# Patient Record
Sex: Female | Born: 1935 | Race: White | Hispanic: No | Smoking: Former smoker
Health system: Southern US, Community
[De-identification: ages and names within clinical notes are randomized; demographics above are authoritative.]

## PROBLEM LIST (undated history)

## (undated) DIAGNOSIS — R112 Nausea with vomiting, unspecified: Secondary | ICD-10-CM

## (undated) DIAGNOSIS — Z9889 Other specified postprocedural states: Secondary | ICD-10-CM

## (undated) DIAGNOSIS — F0390 Unspecified dementia without behavioral disturbance: Secondary | ICD-10-CM

## (undated) DIAGNOSIS — H8109 Meniere's disease, unspecified ear: Secondary | ICD-10-CM

## (undated) DIAGNOSIS — M810 Age-related osteoporosis without current pathological fracture: Secondary | ICD-10-CM

## (undated) DIAGNOSIS — D649 Anemia, unspecified: Secondary | ICD-10-CM

## (undated) DIAGNOSIS — J449 Chronic obstructive pulmonary disease, unspecified: Secondary | ICD-10-CM

## (undated) DIAGNOSIS — I2510 Atherosclerotic heart disease of native coronary artery without angina pectoris: Secondary | ICD-10-CM

## (undated) DIAGNOSIS — E039 Hypothyroidism, unspecified: Secondary | ICD-10-CM

## (undated) DIAGNOSIS — E785 Hyperlipidemia, unspecified: Secondary | ICD-10-CM

## (undated) DIAGNOSIS — R296 Repeated falls: Secondary | ICD-10-CM

## (undated) DIAGNOSIS — M549 Dorsalgia, unspecified: Secondary | ICD-10-CM

---

## 2001-03-31 ENCOUNTER — Ambulatory Visit (HOSPITAL_COMMUNITY): Admission: RE | Admit: 2001-03-31 | Discharge: 2001-03-31 | Payer: Self-pay | Admitting: Family Medicine

## 2001-03-31 ENCOUNTER — Encounter: Payer: Self-pay | Admitting: Family Medicine

## 2003-04-30 ENCOUNTER — Ambulatory Visit (HOSPITAL_COMMUNITY): Admission: RE | Admit: 2003-04-30 | Discharge: 2003-04-30 | Payer: Self-pay | Admitting: Family Medicine

## 2004-04-20 ENCOUNTER — Ambulatory Visit: Payer: Self-pay | Admitting: Orthopedic Surgery

## 2004-05-18 ENCOUNTER — Ambulatory Visit: Payer: Self-pay | Admitting: Orthopedic Surgery

## 2004-07-03 ENCOUNTER — Ambulatory Visit (HOSPITAL_COMMUNITY): Admission: RE | Admit: 2004-07-03 | Discharge: 2004-07-03 | Payer: Self-pay | Admitting: Family Medicine

## 2004-09-16 ENCOUNTER — Other Ambulatory Visit: Admission: RE | Admit: 2004-09-16 | Discharge: 2004-09-16 | Payer: Self-pay | Admitting: Obstetrics and Gynecology

## 2005-03-13 ENCOUNTER — Emergency Department (HOSPITAL_COMMUNITY): Admission: EM | Admit: 2005-03-13 | Discharge: 2005-03-13 | Payer: Self-pay | Admitting: Emergency Medicine

## 2006-05-17 ENCOUNTER — Ambulatory Visit (HOSPITAL_COMMUNITY): Admission: RE | Admit: 2006-05-17 | Discharge: 2006-05-17 | Payer: Self-pay | Admitting: Family Medicine

## 2006-11-03 ENCOUNTER — Ambulatory Visit: Payer: Self-pay | Admitting: Orthopedic Surgery

## 2007-03-01 HISTORY — PX: COLONOSCOPY: SHX174

## 2007-03-13 ENCOUNTER — Ambulatory Visit: Payer: Self-pay | Admitting: Gastroenterology

## 2007-03-13 ENCOUNTER — Encounter: Payer: Self-pay | Admitting: Gastroenterology

## 2007-03-13 ENCOUNTER — Ambulatory Visit (HOSPITAL_COMMUNITY): Admission: RE | Admit: 2007-03-13 | Discharge: 2007-03-13 | Payer: Self-pay | Admitting: Gastroenterology

## 2008-01-22 ENCOUNTER — Ambulatory Visit: Payer: Self-pay | Admitting: Orthopedic Surgery

## 2008-01-22 DIAGNOSIS — M5430 Sciatica, unspecified side: Secondary | ICD-10-CM

## 2008-02-07 ENCOUNTER — Ambulatory Visit: Payer: Self-pay | Admitting: Orthopedic Surgery

## 2008-02-12 ENCOUNTER — Ambulatory Visit (HOSPITAL_COMMUNITY): Admission: RE | Admit: 2008-02-12 | Discharge: 2008-02-12 | Payer: Self-pay | Admitting: Orthopedic Surgery

## 2008-02-19 ENCOUNTER — Ambulatory Visit: Payer: Self-pay | Admitting: Orthopedic Surgery

## 2008-02-19 DIAGNOSIS — M5126 Other intervertebral disc displacement, lumbar region: Secondary | ICD-10-CM

## 2008-02-19 DIAGNOSIS — M479 Spondylosis, unspecified: Secondary | ICD-10-CM | POA: Insufficient documentation

## 2008-02-29 ENCOUNTER — Telehealth: Payer: Self-pay | Admitting: Orthopedic Surgery

## 2008-03-07 ENCOUNTER — Encounter: Admission: RE | Admit: 2008-03-07 | Discharge: 2008-03-07 | Payer: Self-pay | Admitting: Orthopedic Surgery

## 2008-03-28 ENCOUNTER — Encounter: Admission: RE | Admit: 2008-03-28 | Discharge: 2008-03-28 | Payer: Self-pay | Admitting: Orthopedic Surgery

## 2008-08-16 ENCOUNTER — Encounter: Admission: RE | Admit: 2008-08-16 | Discharge: 2008-08-16 | Payer: Self-pay | Admitting: Orthopedic Surgery

## 2009-02-06 ENCOUNTER — Encounter (INDEPENDENT_AMBULATORY_CARE_PROVIDER_SITE_OTHER): Payer: Self-pay | Admitting: *Deleted

## 2009-02-06 ENCOUNTER — Ambulatory Visit: Payer: Self-pay | Admitting: Orthopedic Surgery

## 2009-02-12 ENCOUNTER — Encounter: Admission: RE | Admit: 2009-02-12 | Discharge: 2009-02-12 | Payer: Self-pay | Admitting: Orthopedic Surgery

## 2009-03-05 ENCOUNTER — Encounter: Payer: Self-pay | Admitting: Orthopedic Surgery

## 2010-02-01 ENCOUNTER — Observation Stay (HOSPITAL_COMMUNITY)
Admission: EM | Admit: 2010-02-01 | Discharge: 2010-02-04 | Payer: Self-pay | Source: Home / Self Care | Admitting: Emergency Medicine

## 2010-03-18 ENCOUNTER — Encounter (INDEPENDENT_AMBULATORY_CARE_PROVIDER_SITE_OTHER): Payer: Self-pay | Admitting: *Deleted

## 2010-04-06 ENCOUNTER — Emergency Department (HOSPITAL_COMMUNITY)
Admission: EM | Admit: 2010-04-06 | Discharge: 2010-04-06 | Payer: Self-pay | Source: Home / Self Care | Admitting: Emergency Medicine

## 2010-06-21 ENCOUNTER — Encounter: Payer: Self-pay | Admitting: Family Medicine

## 2010-06-30 NOTE — Letter (Signed)
Summary: Recall Colonoscopy/Endoscopy, Change to Office Visit  [facility]  [address]   [place], [place] [address]   Phone: [phone]  Fax: [phone]      March 18, 2010   [name] [name] [address] [place], [place]  [address] Oct 21, 1925   Dear Ms. MADISON-Lippmann,   According to our records, it is time for you to schedule a Colonoscopy/Endoscopy. However, after reviewing your medical record, we recommend an office visit in order to determine your need for a repeat procedure.  Please call [phone] at your convenience to schedule an office visit. If you have any questions or concerns, please feel free to contact our office.   Sincerely,   Ave Filter  [facility] Ph: [phone]   Fax: [phone]

## 2010-08-13 LAB — URINALYSIS, ROUTINE W REFLEX MICROSCOPIC
Bilirubin Urine: NEGATIVE
Glucose, UA: NEGATIVE mg/dL
Hgb urine dipstick: NEGATIVE
Ketones, ur: NEGATIVE mg/dL
Nitrite: NEGATIVE
Protein, ur: NEGATIVE mg/dL
Specific Gravity, Urine: >1.03 — ABNORMAL HIGH (ref 1.005–1.030)
Urobilinogen, UA: 0.2 mg/dL (ref 0.0–1.0)
pH: 6 (ref 5.0–8.0)

## 2010-08-13 LAB — DIFFERENTIAL
Basophils Absolute: 0 10*3/uL (ref 0.0–0.1)
Basophils Relative: 0 % (ref 0–1)
Eosinophils Absolute: 0.2 K/uL (ref 0.0–0.7)
Eosinophils Absolute: 0.3 10*3/uL (ref 0.0–0.7)
Eosinophils Relative: 3 % (ref 0–5)
Lymphocytes Relative: 26 % (ref 12–46)
Lymphs Abs: 2.3 10*3/uL (ref 0.7–4.0)
Lymphs Abs: 3.2 10*3/uL (ref 0.7–4.0)
Monocytes Absolute: 0.5 K/uL (ref 0.1–1.0)
Monocytes Relative: 5 % (ref 3–12)
Neutro Abs: 5.2 10*3/uL (ref 1.7–7.7)
Neutro Abs: 5.6 10*3/uL (ref 1.7–7.7)
Neutrophils Relative %: 55 % (ref 43–77)
Neutrophils Relative %: 65 % (ref 43–77)

## 2010-08-13 LAB — CBC
HCT: 36.7 % (ref 36.0–46.0)
Hemoglobin: 12.4 g/dL (ref 12.0–15.0)
MCH: 30.6 pg (ref 26.0–34.0)
MCH: 30.7 pg (ref 26.0–34.0)
MCHC: 33.8 g/dL (ref 30.0–36.0)
MCV: 90.8 fL (ref 78.0–100.0)
Platelets: 218 10*3/uL (ref 150–400)
Platelets: 227 10*3/uL (ref 150–400)
RBC: 3.98 MIL/uL (ref 3.87–5.11)
RBC: 4.04 MIL/uL (ref 3.87–5.11)
RDW: 14.6 % (ref 11.5–15.5)
WBC: 8.6 K/uL (ref 4.0–10.5)
WBC: 9.4 10*3/uL (ref 4.0–10.5)

## 2010-08-13 LAB — COMPREHENSIVE METABOLIC PANEL WITH GFR
ALT: 13 U/L (ref 0–35)
AST: 21 U/L (ref 0–37)
Albumin: 3.7 g/dL (ref 3.5–5.2)
Alkaline Phosphatase: 51 U/L (ref 39–117)
Glucose, Bld: 126 mg/dL — ABNORMAL HIGH (ref 70–99)
Potassium: 3.4 meq/L — ABNORMAL LOW (ref 3.5–5.1)
Sodium: 140 meq/L (ref 135–145)
Total Protein: 6.8 g/dL (ref 6.0–8.3)

## 2010-08-13 LAB — COMPREHENSIVE METABOLIC PANEL
BUN: 21 mg/dL (ref 6–23)
CO2: 28 mEq/L (ref 19–32)
Calcium: 9.2 mg/dL (ref 8.4–10.5)
Chloride: 106 mEq/L (ref 96–112)
Creatinine, Ser: 1.14 mg/dL (ref 0.4–1.2)
GFR calc Af Amer: 55 mL/min — ABNORMAL LOW (ref >60)
GFR calc non Af Amer: 46 mL/min — ABNORMAL LOW (ref >60)
Total Bilirubin: 0.3 mg/dL (ref 0.3–1.2)

## 2010-08-13 LAB — BASIC METABOLIC PANEL
CO2: 29 mEq/L (ref 19–32)
Calcium: 9.1 mg/dL (ref 8.4–10.5)
Creatinine, Ser: 1.03 mg/dL (ref 0.4–1.2)
GFR calc Af Amer: >60 mL/min (ref >60)
GFR calc non Af Amer: 51 mL/min — ABNORMAL LOW (ref >60)

## 2010-08-13 LAB — GLUCOSE, CAPILLARY: Glucose-Capillary: 114 mg/dL — ABNORMAL HIGH (ref 70–99)

## 2010-08-13 LAB — T4, FREE: Free T4: 1.5 ng/dL (ref 0.80–1.80)

## 2010-08-13 LAB — SALICYLATE LEVEL: Salicylate Lvl: <4 mg/dL (ref 2.8–20.0)

## 2010-08-13 LAB — MRSA PCR SCREENING: MRSA by PCR: NEGATIVE

## 2010-08-13 LAB — ACETAMINOPHEN LEVEL: Acetaminophen (Tylenol), Serum: <10 ug/mL — ABNORMAL LOW (ref 10–30)

## 2010-08-13 LAB — TSH: TSH: 3.386 u[IU]/mL (ref 0.350–4.500)

## 2010-08-19 ENCOUNTER — Other Ambulatory Visit (HOSPITAL_COMMUNITY): Payer: Self-pay | Admitting: Family Medicine

## 2010-08-19 DIAGNOSIS — Z139 Encounter for screening, unspecified: Secondary | ICD-10-CM

## 2010-08-25 ENCOUNTER — Ambulatory Visit (HOSPITAL_COMMUNITY)
Admission: RE | Admit: 2010-08-25 | Discharge: 2010-08-25 | Disposition: A | Discharge status: Home / Self Care | Payer: Medicare Other | Source: Ambulatory Visit | Attending: Family Medicine | Admitting: Family Medicine

## 2010-08-25 DIAGNOSIS — Z139 Encounter for screening, unspecified: Secondary | ICD-10-CM

## 2010-09-15 ENCOUNTER — Emergency Department (HOSPITAL_COMMUNITY): Payer: No Typology Code available for payment source

## 2010-09-15 ENCOUNTER — Observation Stay (HOSPITAL_COMMUNITY)
Admission: EM | Admit: 2010-09-15 | Discharge: 2010-09-18 | Disposition: A | Discharge status: Nursing Home (Includes ICF) | Payer: No Typology Code available for payment source | Attending: Family Medicine | Admitting: Family Medicine

## 2010-09-15 DIAGNOSIS — M8448XA Pathological fracture, other site, initial encounter for fracture: Principal | ICD-10-CM | POA: Insufficient documentation

## 2010-09-15 DIAGNOSIS — Z79899 Other long term (current) drug therapy: Secondary | ICD-10-CM | POA: Insufficient documentation

## 2010-09-15 DIAGNOSIS — M545 Low back pain, unspecified: Secondary | ICD-10-CM | POA: Insufficient documentation

## 2010-09-15 DIAGNOSIS — Z20828 Contact with and (suspected) exposure to other viral communicable diseases: Secondary | ICD-10-CM | POA: Insufficient documentation

## 2010-09-15 DIAGNOSIS — F0390 Unspecified dementia without behavioral disturbance: Secondary | ICD-10-CM | POA: Insufficient documentation

## 2010-09-15 DIAGNOSIS — R209 Unspecified disturbances of skin sensation: Secondary | ICD-10-CM | POA: Insufficient documentation

## 2010-09-15 DIAGNOSIS — M810 Age-related osteoporosis without current pathological fracture: Secondary | ICD-10-CM | POA: Insufficient documentation

## 2010-09-15 DIAGNOSIS — F319 Bipolar disorder, unspecified: Secondary | ICD-10-CM | POA: Insufficient documentation

## 2010-09-15 DIAGNOSIS — E785 Hyperlipidemia, unspecified: Secondary | ICD-10-CM | POA: Insufficient documentation

## 2010-09-15 DIAGNOSIS — E039 Hypothyroidism, unspecified: Secondary | ICD-10-CM | POA: Insufficient documentation

## 2010-09-15 DIAGNOSIS — R4182 Altered mental status, unspecified: Secondary | ICD-10-CM | POA: Insufficient documentation

## 2010-09-15 DIAGNOSIS — F068 Other specified mental disorders due to known physiological condition: Secondary | ICD-10-CM | POA: Insufficient documentation

## 2010-09-15 LAB — URINALYSIS, ROUTINE W REFLEX MICROSCOPIC
Bilirubin Urine: NEGATIVE
Ketones, ur: NEGATIVE mg/dL
Nitrite: NEGATIVE
Protein, ur: NEGATIVE mg/dL
Urobilinogen, UA: 0.2 mg/dL (ref 0.0–1.0)

## 2010-09-15 LAB — COMPREHENSIVE METABOLIC PANEL
AST: 20 U/L (ref 0–37)
CO2: 27 mEq/L (ref 19–32)
Calcium: 9.4 mg/dL (ref 8.4–10.5)
Creatinine, Ser: 0.95 mg/dL (ref 0.4–1.2)
GFR calc Af Amer: >60 mL/min (ref >60)
GFR calc non Af Amer: 56 mL/min — ABNORMAL LOW (ref >60)

## 2010-09-15 LAB — DIFFERENTIAL
Basophils Relative: 0 % (ref 0–1)
Eosinophils Absolute: 0.2 10*3/uL (ref 0.0–0.7)
Eosinophils Relative: 2 % (ref 0–5)
Monocytes Absolute: 1.1 10*3/uL — ABNORMAL HIGH (ref 0.1–1.0)
Monocytes Relative: 10 % (ref 3–12)
Neutro Abs: 7.3 10*3/uL (ref 1.7–7.7)

## 2010-09-15 LAB — CBC
Hemoglobin: 12.4 g/dL (ref 12.0–15.0)
MCH: 29.5 pg (ref 26.0–34.0)
MCHC: 32.9 g/dL (ref 30.0–36.0)

## 2010-09-15 LAB — INFLUENZA PANEL BY PCR (TYPE A & B)
H1N1 flu by pcr: NOT DETECTED
Influenza B By PCR: NEGATIVE

## 2010-09-16 LAB — MRSA PCR SCREENING: MRSA by PCR: NEGATIVE

## 2010-09-16 LAB — VITAMIN B12: Vitamin B-12: 257 pg/mL (ref 211–911)

## 2010-09-17 LAB — BASIC METABOLIC PANEL
BUN: 15 mg/dL (ref 6–23)
CO2: 27 mEq/L (ref 19–32)
Chloride: 109 mEq/L (ref 96–112)
Glucose, Bld: 98 mg/dL (ref 70–99)
Potassium: 4.3 mEq/L (ref 3.5–5.1)

## 2010-09-22 NOTE — Consult Note (Signed)
  [name] [name], [name] [name]       ACCOUNT NO.:  [id number]  MEDICAL RECORD NO.:  [id number]          PATIENT TYPE:  INP  LOCATION:                                FACILITY:  [facility]  PHYSICIAN:  Vickki Hearing, M.D.DATE OF BIRTH:  21-Sep-1925  DATE OF CONSULTATION:  09/16/2010 DATE OF DISCHARGE:                                CONSULTATION   REQUESTING PHYSICIAN:  Melvyn Novas, MD  REASON FOR CONSULTATION:  Back pain.  HISTORY:  Recorded in the medical record.  This is an 76 year old female, who resides at [facility], who has moderate degree of dementia, history of bipolar disease, has a history of lumbar disk disease, had a compression fracture treated by kyphoplasty presents with increasing back pain over  48 hours.  Radiographs, plain films were negative.  History of breast lumpectomy on the left, right femur fracture, internal fixation and kyphoplasty of L4.  On Pravachol, Namenda, Synthroid, and aspirin, as well as Seroquel.  She could not give a good history, she was confused.  Her vital signs were stable.  Her overall appearance was normal.  Her frame was small.  Her pulses were excellent.  Perfusion was normal.  No obvious lymphadenopathy palpable in cervical area or groin area.  Skin was intact x4 extremities.  Strength was intact x4 extremities.  She was tender at or about L2 in the lumbar spine.  She had mild kyphosis.  She had no focal neurologic deficits.  Muscle tone was normal.  Straight-leg raise was negative.  X-rays initially were negative.  MRI shows a subacute fracture of L2.  I discussed with the patient brace versus kyphoplasty, and she could not really understand me, hopefully a family member is around who can better delineate treatment and plan for her.     Vickki Hearing, M.D.     SEH/MEDQ  D:  09/17/2010  T:  09/17/2010  Job:  [id number]  Electronically Signed by Fuller Canada M.D. on 09/22/2010 08:36:01 AM

## 2010-09-25 ENCOUNTER — Emergency Department (HOSPITAL_COMMUNITY): Payer: No Typology Code available for payment source

## 2010-09-25 ENCOUNTER — Emergency Department (HOSPITAL_COMMUNITY)
Admission: EM | Admit: 2010-09-25 | Discharge: 2010-09-25 | Disposition: A | Discharge status: Home / Self Care | Payer: No Typology Code available for payment source | Attending: Emergency Medicine | Admitting: Emergency Medicine

## 2010-09-25 DIAGNOSIS — M549 Dorsalgia, unspecified: Secondary | ICD-10-CM | POA: Insufficient documentation

## 2010-09-25 DIAGNOSIS — W19XXXA Unspecified fall, initial encounter: Secondary | ICD-10-CM | POA: Insufficient documentation

## 2010-09-25 DIAGNOSIS — Y9210 Unspecified residential institution as the place of occurrence of the external cause: Secondary | ICD-10-CM | POA: Insufficient documentation

## 2010-09-25 DIAGNOSIS — Z79899 Other long term (current) drug therapy: Secondary | ICD-10-CM | POA: Insufficient documentation

## 2010-09-25 DIAGNOSIS — R55 Syncope and collapse: Secondary | ICD-10-CM | POA: Insufficient documentation

## 2010-09-25 DIAGNOSIS — E039 Hypothyroidism, unspecified: Secondary | ICD-10-CM | POA: Insufficient documentation

## 2010-09-25 DIAGNOSIS — T148XXA Other injury of unspecified body region, initial encounter: Secondary | ICD-10-CM | POA: Insufficient documentation

## 2010-09-25 DIAGNOSIS — I6529 Occlusion and stenosis of unspecified carotid artery: Secondary | ICD-10-CM | POA: Insufficient documentation

## 2010-09-25 DIAGNOSIS — F0390 Unspecified dementia without behavioral disturbance: Secondary | ICD-10-CM | POA: Insufficient documentation

## 2010-09-25 DIAGNOSIS — Y998 Other external cause status: Secondary | ICD-10-CM | POA: Insufficient documentation

## 2010-09-25 DIAGNOSIS — M810 Age-related osteoporosis without current pathological fracture: Secondary | ICD-10-CM | POA: Insufficient documentation

## 2010-09-25 DIAGNOSIS — E785 Hyperlipidemia, unspecified: Secondary | ICD-10-CM | POA: Insufficient documentation

## 2010-09-25 DIAGNOSIS — Z7982 Long term (current) use of aspirin: Secondary | ICD-10-CM | POA: Insufficient documentation

## 2010-09-25 DIAGNOSIS — N390 Urinary tract infection, site not specified: Secondary | ICD-10-CM | POA: Insufficient documentation

## 2010-09-25 LAB — URINALYSIS, ROUTINE W REFLEX MICROSCOPIC
Glucose, UA: NEGATIVE mg/dL
Ketones, ur: NEGATIVE mg/dL
Specific Gravity, Urine: 1.02 (ref 1.005–1.030)
pH: 7 (ref 5.0–8.0)

## 2010-09-25 LAB — DIFFERENTIAL
Basophils Relative: 0 % (ref 0–1)
Lymphs Abs: 1.4 10*3/uL (ref 0.7–4.0)
Monocytes Relative: 7 % (ref 3–12)
Neutro Abs: 7.6 10*3/uL (ref 1.7–7.7)
Neutrophils Relative %: 77 % (ref 43–77)

## 2010-09-25 LAB — URINE MICROSCOPIC-ADD ON

## 2010-09-25 LAB — CBC
Hemoglobin: 12.6 g/dL (ref 12.0–15.0)
MCH: 29.2 pg (ref 26.0–34.0)
MCV: 89.6 fL (ref 78.0–100.0)
RBC: 4.31 MIL/uL (ref 3.87–5.11)
WBC: 9.8 10*3/uL (ref 4.0–10.5)

## 2010-09-25 LAB — BASIC METABOLIC PANEL
Chloride: 104 mEq/L (ref 96–112)
GFR calc Af Amer: >60 mL/min (ref >60)
Potassium: 3.4 mEq/L — ABNORMAL LOW (ref 3.5–5.1)

## 2010-09-25 LAB — POCT CARDIAC MARKERS: Troponin i, poc: <0.05 ng/mL (ref 0.00–0.09)

## 2010-09-27 LAB — URINE CULTURE

## 2010-09-29 NOTE — H&P (Signed)
NAMESHANNEN, FLANSBURG       ACCOUNT NO.:  [id number]  MEDICAL RECORD NO.:  [id number]           PATIENT TYPE:  I  LOCATION:  A210                          FACILITY:  [facility]  PHYSICIAN:  Melvyn Novas, MDDATE OF BIRTH:  [birth year]  DATE OF ADMISSION:  09/15/2010 DATE OF DISCHARGE:  LH                             HISTORY & PHYSICAL   The patient is an 77 year old white female resident of [place] who has some mild-to-moderate degree of dementia as well as bipolarity who apparently has been complaining of increasing severe back pain for a 48-hour period.  This is unremitting and has some mild radiation to her left buttock.  She was seen in the ER and found to have no evidence of UTI.  Lumbosacral x-rays reveal a 50% anterior compression fracture which is unchanged from previous compression fracture documented by MRI in 2009, for which she has had kyphoplasty, concern is that she has some foraminal stenosis and possible left lumbar radiculopathy on the basis of this. We will perform MRI to see if there is any interval change. She denies angina, orthopnea, PND, dyspnea, no dysuria or frequency.  No rigors or chills.  PAST MEDICAL HISTORY:  Significant aforementioned moderate dementia, hypothyroidism, hyperlipidemia, osteoporosis, L4 compression fracture, peripheral arterial disease, bipolar disorder and history of UTI.  PAST SURGICAL HISTORY:  Remarkable for left breast lumpectomy, right femur fracture pending and kyphoplasty of L4 done within the last 2 years.  CURRENT MEDICATIONS: 1. Pravachol 40 mg p.o. daily. 2. Namenda 10 mg p.o. b.i.d. 3. Synthroid 75 mcg p.o. daily. 4. Aspirin 81 mg per day. 5. Seroquel 100 mg p.o. at bedtime.  PHYSICAL EXAMINATION:  VITAL SIGNS:  Blood pressure 154/55, temperature 98.6, pulse 69 and regular and respiratory rate is 20. EYES:  Extraocular movements intact.  Sclerae clear.  Conjunctivae pink. NECK:  No JVD, no carotid  bruits, no thyromegaly or bruits. LUNGS:  Clear to A and P.  No rales, wheeze or rhonchi appreciable. HEART:  Regular rhythm.  A 1/6 aortic outflow murmur.  No S3, S4, or gallops.  No heaves, thrills, or rubs. ABDOMEN:  Soft and nontender.  Bowel sounds are normoactive.  No guarding, rebound, or hepatosplenomegaly.  There is no CVA tenderness bilaterally.  No guarding, no rebound, or masses. ABDOMEN:  Left-to-right straight leg raise at 30 degrees produce pain. Plantars are downgoing bilaterally.  Sensory motor is 5/5 bilaterally. NEUROLOGIC:  Cranial nerves II through XII are grossly intact.  The patient is mildly disoriented to place, but not person and mildly disoriented to time.  IMPRESSION: 1. Severe low back pain unremitting in the phase of anterior L4     compression fracture, status post kyphoplasty. 2. Dementia. 3. Hyperlipidemia. 4. Osteoporosis. 5. Hypothyroidism. 6. Dementia and bipolarity.  PLAN:  At present is to perform RPR, B12 and TSH levels.  Get MRI of LS spine to see if there is any nerve compression or foraminal stenosis, continue to add Vicodin 5/500 p.o. q.i.d. p.r.n. and we will perform serial neurologic exams and decide if any intervention is warranted on the basis of this.  I hope CT scan of her head reveals no interval change,  small vessel disease and cerebral atrophy.     Melvyn Novas, MD     RMD/MEDQ  D:  09/15/2010  T:  09/16/2010  Job:  [id number]  Electronically Signed by Oval Linsey MD on 09/29/2010 01:43:06 PM

## 2010-09-29 NOTE — H&P (Signed)
  NAMEJONDA, [name] [name] NO.:  [id number]  MEDICAL RECORD NO.:  [id number]           PATIENT TYPE:  LOCATION:                                 FACILITY:  PHYSICIAN:  Melvyn Novas, MDDATE OF BIRTH:  06-Feb-1926  DATE OF ADMISSION: DATE OF DISCHARGE:  LH                             HISTORY & PHYSICAL   The patient is an 77 year old white female, resident of [place], with a mild-to-moderate degree of dementia, some manic behavior, some agitation history with hyperlipidemia who was well controlled. Apparently, she complained of sudden onset of 48-hour history of low back pain which did not sort of radiate.  She had a previous anterior compression fracture status post kyphoplasty approximately 2 years ago of L4 and had been doing well from that.  X-rays revealed no further significant interval change of her lumbosacral spine.  Due to possible thought of a radicular pain and herniated nucleus pulposus, an MRI was performed while in the hospital.  The MRI revealed loss of height at approximately 20% and acute or subacute endplate compression of L2 which was new and an interval change from previous MRIs.  This was followed with an old healed compression deformity at L4 which was previously mentioned.  The patient was seen in consultation by Dr. Romeo Apple, Orthopedics, and consideration was given to kyphoplasty.  It was elected to give her conservative therapy in the form of a CASH brace which she is to wear and analgesia/opioid in the form of Percocet 5/325 p.o. q.i.d. p.r.n. as well as physical therapy and a four-point walker.  The patient has minimal understanding of this.  She does not seem to comprehend medical issues; however, while in hospital, she turned out positive PCR for influenza A and there was no clinical evidence of influenza infection.  Due to fact that this is not reliable as for diagnosis of influenza A but rather exposure, she was  empirically given Tamiflu 75 p.o. b.i.d. for the 3 days while in hospital, she was continued on this for another additional 5 days while returning to [facility] with body fluid precautions.  DISCHARGE MEDICINES: 1. Hydrocodone 5/325 one p.o. q.i.d. p.r.n. for pain. 2. Tamiflu 75 p.o. b.i.d. for an additional 5 days. 3. Aspirin 81 mg p.o. daily. 4. Synthroid 75 mcg p.o. daily. 5. Namenda 10 mg p.o. b.i.d. 6. Pravastatin 40 mg p.o. daily. 7. Seroquel 100 mg p.o. nightly. 8. Vitamin B1/thiamine 100 mg p.o. daily.  She will follow up in the office in two week's time and also with Dr. Romeo Apple, Orthopedics.     Melvyn Novas, MD     RMD/MEDQ  D:  09/17/2010  T:  09/18/2010  Job:  [id number]  Electronically Signed by Oval Linsey MD on 09/29/2010 01:43:11 PM

## 2010-10-13 NOTE — Op Note (Signed)
[name] [name], [name] [name]              ACCOUNT NO.:  [id number]   MEDICAL RECORD NO.:  [id number]          PATIENT TYPE:  AMB   LOCATION:  DAY                           FACILITY:  [facility]   PHYSICIAN:  Kassie Mends, M.D.      DATE OF BIRTH:  24-Feb-1926   DATE OF PROCEDURE:  03/13/2007  DATE OF DISCHARGE:                               OPERATIVE REPORT   REFERRING PHYSICIAN:  Oval Linsey, MD   PROCEDURE:  Colonoscopy with cold forceps and snare cautery polypectomy  with Spot tattoo.   INDICATION FOR EXAM:  Ms. Wyn Forster is a 77 year old female who presents  for average risk colon cancer screening.  She has never had a  colonoscopy.   FINDINGS:  1. Pancolonic diverticulosis.  2. One 4 mm sessile ascending colon polyp removed via cold forceps.  3. Two 4-6 mm rectal polyps which were sessile.  One was removed via      cold forceps and the other via snare cautery.  4. A 1.5 cm pedunculated rectal polyp removed via snare cautery. The      site of the 1.5 cm polyp was tattooed with Spot.  5. Otherwise, no masses inflammatory changes, AVMs seen.  6. Normal retroflexed view of the rectum.   RECOMMENDATIONS:  1. We will call Ms. Madison with the results of her biopsies.  2. No aspirin or nonsteroidal anti-inflammatory drugs or      anticoagulation x7 days.  3. She should follow a high fiber diet.  She is given a handout on      high fiber diet, polyps, and diverticulosis.  4. Screening colonoscopy in 5 years if she remains healthy.   MEDICATIONS:  1. Demerol 25 mg IV.  2. Versed 3 mg IV.   PROCEDURE TECHNIQUE:  Physical exam was performed, and informed consent  was obtained from the patient after explaining the benefits, risks, and  alternatives to the procedure.  The patient was connected to the monitor  and placed in the left lateral position.  Continuous oxygen was provided  by nasal cannula and IV medicine mentioned through an indwelling  cannula.  After administration of sedation  and rectal exam, the  patient's rectum was intubated,  and the scope was advanced under direct visualization to the cecum.  The  scope was removed slowly by carefully examining the colon texture,  anatomy, and integrity of the mucosa on the way out.  The patient was  recovered in endoscopy and discharged to home in satisfactory condition.      Kassie Mends, M.D.  Electronically Signed     SM/MEDQ  D:  03/13/2007  T:  03/13/2007  Job:  [id number]   cc:   Melvyn Novas, MD  Fax: [phone]

## 2010-10-29 ENCOUNTER — Ambulatory Visit (INDEPENDENT_AMBULATORY_CARE_PROVIDER_SITE_OTHER): Payer: Medicare Other | Admitting: Orthopedic Surgery

## 2010-10-29 ENCOUNTER — Ambulatory Visit: Payer: Medicare Other | Admitting: Orthopedic Surgery

## 2010-10-29 DIAGNOSIS — S32009A Unspecified fracture of unspecified lumbar vertebra, initial encounter for closed fracture: Secondary | ICD-10-CM

## 2010-10-29 DIAGNOSIS — S32000A Wedge compression fracture of unspecified lumbar vertebra, initial encounter for closed fracture: Secondary | ICD-10-CM | POA: Insufficient documentation

## 2010-10-29 NOTE — Progress Notes (Signed)
Fracture followup.  Date of injury April 17  L2 acute/subacute vertebral body fracture. History of old L4 fracture.  Fracture diagnosed by MRI.  Patient has made significant improvements. Despite not wearing the brace was ordered.  Using bleeding without assistive device and has minimal symptoms not requiring any pain medication. Her neurologic symptoms are noted.  Radiograph was taken today.  Radiograph shows old L4 fracture recent L2 fracture with compression of the vertebrae. No kyphosis.  Patient is released

## 2011-01-08 NOTE — Discharge Summary (Signed)
NAME:  MATTHEW, PAIS            ACCOUNT NO.:  MEDICAL RECORD NO.:  [id number]  LOCATION:                                 FACILITY:  PHYSICIAN:  Melvyn Novas, MDDATE OF BIRTH:  Feb 26, 1926  DATE OF ADMISSION: DATE OF DISCHARGE:  LH                              DISCHARGE SUMMARY   HISTORY OF PRESENT ILLNESS:  The patient is an 77 year old white female resident of [place] who complained of increasing severity of back pain for 48-hour period subsequently admitted.  She had a history of old L4 compression fracture.  She was admitted due to significant nature of her chest pain and was felt she may have herniated nucleus pulposus with foraminal stenosis.  She was hemodynamically stable on admission.  She also has a history of dementia, hyperlipidemia, hypothyroidism, and agitation.  She was controlled with opioid analgesia, seen in consultation by Orthopedics and subsequent MRI was done to her lumbosacral spine which revealed old healed L4 compression fracture which was not new, however, there was a new acute or subacute compression fracture loss of 5-20% of L2.  This looked to be benign fracture, pathologic and due to osteoporosis.  The patient was recommended to have DEXA scan and assess her osteoporotic degree and was felt at this time the patient was stable.  Due to her advanced age, surgery which is kyphoplasty which was done previously is not indicated at present.  Conservative therapy was recommended.  MEDICATIONS:  She was subsequently discharged on the following medicines: 1. Pravachol 40 mg p.o. daily. 2. Namenda 10 mg p.o. b.i.d. 3. Synthroid 75 mcg p.o. daily. 4. Aspirin 81 mg p.o. daily. 5. Seroquel 100 mg p.o. at bedtime. 6. Vicodin 5/500 p.o. t.i.d. p.r.n. 7. Calcium plus vitamin D 600/400 p.o. daily. 8. Fosamax 70 mg p.o. q.week.  The patient will follow up in the office in one week's time to assess analgesia and any neurologic deficits  that may or may not ensue.     Melvyn Novas, MD     RMD/MEDQ  D:  01/07/2011  T:  01/07/2011  Job:  [id number]

## 2011-03-18 ENCOUNTER — Emergency Department (HOSPITAL_COMMUNITY)
Admission: EM | Admit: 2011-03-18 | Discharge: 2011-03-18 | Disposition: A | Discharge status: Nursing Home (Includes ICF) | Payer: Medicare Other | Attending: Emergency Medicine | Admitting: Emergency Medicine

## 2011-03-18 ENCOUNTER — Encounter: Payer: Self-pay | Admitting: *Deleted

## 2011-03-18 ENCOUNTER — Emergency Department (HOSPITAL_COMMUNITY): Payer: Medicare Other

## 2011-03-18 DIAGNOSIS — S0093XA Contusion of unspecified part of head, initial encounter: Secondary | ICD-10-CM

## 2011-03-18 DIAGNOSIS — M949 Disorder of cartilage, unspecified: Secondary | ICD-10-CM | POA: Insufficient documentation

## 2011-03-18 DIAGNOSIS — S1093XA Contusion of unspecified part of neck, initial encounter: Secondary | ICD-10-CM | POA: Insufficient documentation

## 2011-03-18 DIAGNOSIS — S0003XA Contusion of scalp, initial encounter: Secondary | ICD-10-CM | POA: Insufficient documentation

## 2011-03-18 DIAGNOSIS — Y9210 Unspecified residential institution as the place of occurrence of the external cause: Secondary | ICD-10-CM | POA: Insufficient documentation

## 2011-03-18 DIAGNOSIS — W19XXXA Unspecified fall, initial encounter: Secondary | ICD-10-CM | POA: Insufficient documentation

## 2011-03-18 DIAGNOSIS — F0390 Unspecified dementia without behavioral disturbance: Secondary | ICD-10-CM | POA: Insufficient documentation

## 2011-03-18 DIAGNOSIS — M899 Disorder of bone, unspecified: Secondary | ICD-10-CM | POA: Insufficient documentation

## 2011-03-18 DIAGNOSIS — Z9181 History of falling: Secondary | ICD-10-CM | POA: Insufficient documentation

## 2011-03-18 HISTORY — DX: Unspecified dementia, unspecified severity, without behavioral disturbance, psychotic disturbance, mood disturbance, and anxiety: F03.90

## 2011-03-18 HISTORY — DX: Repeated falls: R29.6

## 2011-03-18 NOTE — ED Notes (Signed)
Fell at [employer], denies new pain per [facility], pt with chronic back pain

## 2011-03-18 NOTE — ED Notes (Signed)
Patient moved to hallway for better observation due to pt's dementia and that she has frequent falls at facility from where pt came from

## 2011-03-18 NOTE — ED Notes (Signed)
Report called to [facility]. 

## 2011-03-18 NOTE — ED Provider Notes (Signed)
History    Scribed for Benny Lennert, MD, the patient was seen in room APAH4/APAH4. This chart was scribed by Katha Cabal.   CSN: [id number] Arrival date & time: 03/18/2011  6:54 PM   First MD Initiated Contact with Patient 03/18/11 1948      Chief Complaint  Patient presents with  . Fall    c-collar and LSB in place by [facility]    (Consider location/radiation/quality/duration/timing/severity/associated sxs/prior treatment) HPI  Level 5 caveat applies for dementia.   [name] [name] is a 77 y.o. female brought in by ambulance, who presents to the Emergency Department complaining of fall.  Per EMS:  Patient fell at [facility]. Patient does not report pain and head injury.  Patient arrived with C collar and backboard.  Patient with hx of falls and chronic back pain.     [name] Stalling, MD   Past Medical History  Diagnosis Date  . Dementia   . Falls frequently     History reviewed. No pertinent past surgical history.  History reviewed. No pertinent family history.  History  Substance Use Topics  . Smoking status: Not on file  . Smokeless tobacco: Not on file  . Alcohol Use: No    OB History    Grav Para Term Preterm Abortions TAB SAB Ect Mult Living                  Review of Systems  Unable to perform ROS: Dementia    Allergies  Codeine; Penicillins; and Sulfonamide derivatives  Home Medications   Current Outpatient Rx  Name Route Sig Dispense Refill  . ASPIRIN 81 MG PO TABS Oral Take 81 mg by mouth daily.      Marland Kitchen HYDROCODONE-ACETAMINOPHEN 5-500 MG PO TABS Oral Take 1 tablet by mouth every 6 (six) hours as needed. For pain    . LEVOTHYROXINE SODIUM 75 MCG PO TABS Oral Take 75 mcg by mouth daily.      Marland Kitchen MEMANTINE HCL 10 MG PO TABS Oral Take 10 mg by mouth daily.     Marland Kitchen PRAVASTATIN SODIUM 40 MG PO TABS Oral Take 40 mg by mouth at bedtime.     Marland Kitchen QUETIAPINE FUMARATE 100 MG PO TABS Oral Take 100 mg by mouth at bedtime.      Marland Kitchen VITAMIN B-1 100 MG PO TABS  Oral Take 100 mg by mouth daily.        BP 150/65  Pulse 72  Temp(Src) 97.9 F (36.6 C) (Oral)  Resp 18  SpO2 93%  Physical Exam  Constitutional: [name] [name] appears well-developed. No distress.  HENT:  Head: Normocephalic and atraumatic.  Eyes: Conjunctivae and EOM are normal. No scleral icterus.  Neck: Neck supple. No thyromegaly present.  Cardiovascular: Normal rate and regular rhythm.  Exam reveals no gallop and no friction rub.   No murmur heard. Pulmonary/Chest: Effort normal. No stridor. No respiratory distress. [name] [name] has no wheezes. [name] [name] has no rales. [name] [name] exhibits no tenderness.  Abdominal: [name] [name] exhibits no distension. There is no tenderness. There is no rebound.  Musculoskeletal: Normal range of motion. [name] [name] exhibits no edema.  Lymphadenopathy:    [name] [name] has no cervical adenopathy.  Neurological: [name] [name] is alert. Coordination normal.       Oriented to person only.     Skin: No rash noted. No erythema.  Psychiatric: [name] [name] has a normal mood and affect. Her behavior is normal.    ED Course  Procedures (including critical care time)   DIAGNOSTIC STUDIES: Oxygen Saturation  is 93% on room air, adequate by my interpretation.    COORDINATION OF CARE:  7:56 PM  Physical exam complete.  Will CT head and C spine.  If normal plan to discharge patient. 8:28 PM  Reviewed CT head and C spine impression.      LABS / RADIOLOGY:  Labs Reviewed - No data to display   Ct Head Wo Contrast  03/18/2011  *RADIOLOGY REPORT*  Clinical Data:  Fall, dementia  CT HEAD WITHOUT CONTRAST CT CERVICAL SPINE WITHOUT CONTRAST  Technique:  Multidetector CT imaging of the head and cervical spine was performed following the standard protocol without intravenous contrast.  Multiplanar CT image reconstructions of the cervical spine were also generated.  Comparison:  09/15/2010, 04/06/2010  CT HEAD  Findings: Mild diffuse brain atrophy including the cerebellum.  No acute intracranial hemorrhage, definite infarction, mass  lesion, midline shift, herniation, hydrocephalus, or extra-axial fluid collection.  Gray-white matter differentiation maintained. Cisterns patent.  Orbits are symmetric.  Mastoids and sinuses are clear.  Atherosclerosis of the intracranial vessels at the skull base.  IMPRESSION: Stable atrophy without acute intracranial process   CT CERVICAL SPINE  Findings: Diffuse osteopenia noted.  Normal alignment.  No fracture, compression deformity, or focal kyphosis.  Facets aligned.  Mild degenerative changes and facet arthropathy.  Central disc protrusion again noted at the C4-5.  Bilateral carotid calcifications present.  IMPRESSION: No acute fracture or malalignment.  C4-5 central disc protrusion.  Original Report Authenticated By: Judie Petit. Ruel Favors, M.D.        MDM  MDM:      IMPRESSION: 1. Fall   2. Contusion of head       The chart was scribed for me under my direct supervision.  I personally performed the history, physical, and medical decision making and all procedures in the evaluation of this patient.Benny Lennert, MD 03/18/11 2118

## 2011-03-18 NOTE — ED Notes (Signed)
Patient had pulled C-collar off, EDP is aware and stated that CT's were okay

## 2011-03-18 NOTE — ED Notes (Signed)
Pt left ter stating no needs with her daughter who is going to take her back to [facility]

## 2011-04-22 ENCOUNTER — Emergency Department (HOSPITAL_COMMUNITY)
Admission: EM | Admit: 2011-04-22 | Discharge: 2011-04-22 | Disposition: A | Discharge status: Nursing Home (Includes ICF) | Payer: Medicare Other | Attending: Emergency Medicine | Admitting: Emergency Medicine

## 2011-04-22 ENCOUNTER — Emergency Department (HOSPITAL_COMMUNITY): Payer: Medicare Other

## 2011-04-22 ENCOUNTER — Encounter (HOSPITAL_COMMUNITY): Payer: Self-pay | Admitting: Emergency Medicine

## 2011-04-22 DIAGNOSIS — W19XXXA Unspecified fall, initial encounter: Secondary | ICD-10-CM | POA: Insufficient documentation

## 2011-04-22 DIAGNOSIS — S7010XA Contusion of unspecified thigh, initial encounter: Secondary | ICD-10-CM | POA: Insufficient documentation

## 2011-04-22 DIAGNOSIS — Z7982 Long term (current) use of aspirin: Secondary | ICD-10-CM | POA: Insufficient documentation

## 2011-04-22 DIAGNOSIS — M25559 Pain in unspecified hip: Secondary | ICD-10-CM | POA: Insufficient documentation

## 2011-04-22 DIAGNOSIS — Z9181 History of falling: Secondary | ICD-10-CM | POA: Insufficient documentation

## 2011-04-22 DIAGNOSIS — IMO0002 Reserved for concepts with insufficient information to code with codable children: Secondary | ICD-10-CM

## 2011-04-22 DIAGNOSIS — F0390 Unspecified dementia without behavioral disturbance: Secondary | ICD-10-CM | POA: Insufficient documentation

## 2011-04-22 DIAGNOSIS — Y9210 Unspecified residential institution as the place of occurrence of the external cause: Secondary | ICD-10-CM | POA: Insufficient documentation

## 2011-04-22 NOTE — ED Provider Notes (Signed)
Scribed for Felisa Bonier, MD, the patient was seen in room APA19/APA19. This chart was scribed by [employer]. The patient's care started at 0837  CSN: [id number] Arrival date & time: 04/22/2011  8:36 AM   First MD Initiated Contact with Patient 04/22/11 [phone]      Chief Complaint  Patient presents with  . Fall  . Hip Pain    right hip   HPI Level 5 Caveat for Dementia [name] [name] is a 77 y.o. female who presents to the Emergency Department complaining of Fall. Per [employer], patient fell sometime this morning. Fall was unwitnessd. She complains of pain in the right hip and thigh. History of Present Illness limited by Dementia.  Past Medical History  Diagnosis Date  . Dementia   . Falls frequently     History reviewed. No pertinent past surgical history.  History reviewed. No pertinent family history.  History  Substance Use Topics  . Smoking status: Not on file  . Smokeless tobacco: Not on file  . Alcohol Use: No    OB History    Grav Para Term Preterm Abortions TAB SAB Ect Mult Living                  Review of Systems  Allergies  Codeine; Penicillins; and Sulfonamide derivatives  Home Medications   Current Outpatient Rx  Name Route Sig Dispense Refill  . ASPIRIN 81 MG PO TABS Oral Take 81 mg by mouth daily.      Marland Kitchen LEVOTHYROXINE SODIUM 75 MCG PO TABS Oral Take 75 mcg by mouth daily.      Marland Kitchen MEMANTINE HCL 10 MG PO TABS Oral Take 10 mg by mouth daily.     Marland Kitchen PRAVASTATIN SODIUM 40 MG PO TABS Oral Take 40 mg by mouth at bedtime.     Marland Kitchen QUETIAPINE FUMARATE 100 MG PO TABS Oral Take 100 mg by mouth at bedtime.      Marland Kitchen VITAMIN B-1 100 MG PO TABS Oral Take 100 mg by mouth daily.      Marland Kitchen HYDROCODONE-ACETAMINOPHEN 5-500 MG PO TABS Oral Take 1 tablet by mouth every 6 (six) hours as needed. For pain      BP 151/65  Pulse 74  Temp(Src) 97.3 F (36.3 C) (Oral)  Resp 20  SpO2 100%  Physical Exam  Nursing note and vitals reviewed. Constitutional: She  is oriented to person, place, and time. She appears well-developed and well-nourished.  Non-toxic appearance. She does not have a sickly appearance.  HENT:  Head: Normocephalic and atraumatic.  Eyes: Conjunctivae, EOM and lids are normal. Pupils are equal, round, and reactive to light. No scleral icterus.  Neck: Trachea normal and normal range of motion. Neck supple.  Cardiovascular: Regular rhythm and normal heart sounds.   Pulmonary/Chest: Effort normal and breath sounds normal. No respiratory distress. She has no wheezes. She has no rales.  Abdominal: Soft. Normal appearance. There is no tenderness. There is no rebound, no guarding and no CVA tenderness.       Pelvis is stable  Musculoskeletal: Normal range of motion.       Some pain with anterior flexion of the right hip. Tenderness to palpation on the lateral right hip No deformity or bruising of the right hip She moves the right lower extremity spontaneously. No tenderness or deformity along the entire spine  Neurological: She is alert and oriented to person, place, and time. She has normal strength.       Good  distal pulse and sensation at the right lower leg and foot  Skin: Skin is warm, dry and intact. No rash noted.    ED Course  Procedures  DIAGNOSTIC STUDIES: Oxygen Saturation is 100% on room air, normal by my interpretation.    COORDINATION OF CARE: 08:48 - EDP examined patient at bedside. The following orders have been placed Orders Placed This Encounter  Procedures  . DG Hip Complete Right    Dg Hip Complete Right  04/22/2011  *RADIOLOGY REPORT*  Clinical Data: Fall.  Right hip injury and pain.  RIGHT HIP - COMPLETE 2+ VIEW  Comparison: None.  Findings: No evidence of right hip fracture or dislocation.  No evidence of hip arthropathy or other bone lesions.  Generalized osteopenia noted.  IMPRESSION:  1.  No evidence of fracture or dislocation. 2.  Osteopenia.  Original Report Authenticated By: Danae Orleans, M.D.     MDM: Contusion of hip, hip fracture or both entertained in the patient's differential diagnosis here it no other injuries are suggested by history or physical exam.   Scribe Attestation I personally performed the services described in this documentation, which was scribed in my presence. The recorded information has been reviewed and considered.       Felisa Bonier, MD 04/22/11 [phone]

## 2011-04-22 NOTE — ED Notes (Signed)
Per [employer] pt fell sometime this am unwitnessed. Pt has dementia and was difficult to assess for pain per staff. Pt favoring right hip per staff.

## 2011-07-08 ENCOUNTER — Ambulatory Visit (HOSPITAL_COMMUNITY)
Admission: RE | Admit: 2011-07-08 | Discharge: 2011-07-08 | Disposition: A | Discharge status: Home / Self Care | Payer: Medicare Other | Source: Ambulatory Visit | Attending: Family Medicine | Admitting: Family Medicine

## 2011-07-08 ENCOUNTER — Other Ambulatory Visit (HOSPITAL_COMMUNITY): Payer: Self-pay | Admitting: Family Medicine

## 2011-07-08 DIAGNOSIS — R4182 Altered mental status, unspecified: Secondary | ICD-10-CM | POA: Insufficient documentation

## 2011-07-08 DIAGNOSIS — R410 Disorientation, unspecified: Secondary | ICD-10-CM

## 2011-08-26 ENCOUNTER — Emergency Department (HOSPITAL_COMMUNITY)
Admission: EM | Admit: 2011-08-26 | Discharge: 2011-08-26 | Disposition: A | Discharge status: Home / Self Care | Payer: Medicare Other | Attending: Emergency Medicine | Admitting: Emergency Medicine

## 2011-08-26 ENCOUNTER — Encounter (HOSPITAL_COMMUNITY): Payer: Self-pay | Admitting: Emergency Medicine

## 2011-08-26 ENCOUNTER — Emergency Department (HOSPITAL_COMMUNITY): Payer: Medicare Other

## 2011-08-26 DIAGNOSIS — Z79899 Other long term (current) drug therapy: Secondary | ICD-10-CM | POA: Insufficient documentation

## 2011-08-26 DIAGNOSIS — W19XXXA Unspecified fall, initial encounter: Secondary | ICD-10-CM

## 2011-08-26 DIAGNOSIS — M542 Cervicalgia: Secondary | ICD-10-CM | POA: Insufficient documentation

## 2011-08-26 DIAGNOSIS — R51 Headache: Secondary | ICD-10-CM | POA: Insufficient documentation

## 2011-08-26 DIAGNOSIS — M25559 Pain in unspecified hip: Secondary | ICD-10-CM | POA: Insufficient documentation

## 2011-08-26 DIAGNOSIS — S5000XA Contusion of unspecified elbow, initial encounter: Secondary | ICD-10-CM | POA: Insufficient documentation

## 2011-08-26 DIAGNOSIS — Y9210 Unspecified residential institution as the place of occurrence of the external cause: Secondary | ICD-10-CM | POA: Insufficient documentation

## 2011-08-26 DIAGNOSIS — S5010XA Contusion of unspecified forearm, initial encounter: Secondary | ICD-10-CM

## 2011-08-26 DIAGNOSIS — Z9181 History of falling: Secondary | ICD-10-CM | POA: Insufficient documentation

## 2011-08-26 DIAGNOSIS — W1809XA Striking against other object with subsequent fall, initial encounter: Secondary | ICD-10-CM | POA: Insufficient documentation

## 2011-08-26 DIAGNOSIS — S0003XA Contusion of scalp, initial encounter: Secondary | ICD-10-CM | POA: Insufficient documentation

## 2011-08-26 DIAGNOSIS — F0390 Unspecified dementia without behavioral disturbance: Secondary | ICD-10-CM | POA: Insufficient documentation

## 2011-08-26 DIAGNOSIS — S0990XA Unspecified injury of head, initial encounter: Secondary | ICD-10-CM | POA: Insufficient documentation

## 2011-08-26 DIAGNOSIS — S1093XA Contusion of unspecified part of neck, initial encounter: Secondary | ICD-10-CM | POA: Insufficient documentation

## 2011-08-26 DIAGNOSIS — Z7982 Long term (current) use of aspirin: Secondary | ICD-10-CM | POA: Insufficient documentation

## 2011-08-26 NOTE — ED Notes (Signed)
Patient from [facility]. Experienced fall tonight. HAS C COLLAR ON. Hit head on door while falling. Has large hematoma to back of head, lower right side. Patient has history of dementia, denies pain at this time.

## 2011-08-26 NOTE — ED Notes (Signed)
Patient now complaining of right hip pain.

## 2011-08-26 NOTE — Discharge Instructions (Signed)
Contusion A contusion is a deep bruise. Contusions are the result of an injury that caused bleeding under the skin. The contusion may turn blue, purple, or yellow. Minor injuries will give you a painless contusion, but more severe contusions may stay painful and swollen for a few weeks.  CAUSES  A contusion is usually caused by a blow, trauma, or direct force to an area of the body. SYMPTOMS   Swelling and redness of the injured area.   Bruising of the injured area.   Tenderness and soreness of the injured area.   Pain.  DIAGNOSIS  The diagnosis can be made by taking a history and physical exam. An X-ray, CT scan, or MRI may be needed to determine if there were any associated injuries, such as fractures. TREATMENT  Specific treatment will depend on what area of the body was injured. In general, the best treatment for a contusion is resting, icing, elevating, and applying cold compresses to the injured area. Over-the-counter medicines may also be recommended for pain control. Ask your caregiver what the best treatment is for your contusion. HOME CARE INSTRUCTIONS   Put ice on the injured area.   Put ice in a plastic bag.   Place a towel between your skin and the bag.   Leave the ice on for 15 to 20 minutes, 3 to 4 times a day.   Only take over-the-counter or prescription medicines for pain, discomfort, or fever as directed by your caregiver. Your caregiver may recommend avoiding anti-inflammatory medicines (aspirin, ibuprofen, and naproxen) for 48 hours because these medicines may increase bruising.   Rest the injured area.   If possible, elevate the injured area to reduce swelling.  SEEK IMMEDIATE MEDICAL CARE IF:   You have increased bruising or swelling.   You have pain that is getting worse.   Your swelling or pain is not relieved with medicines.  MAKE SURE YOU:   Understand these instructions.   Will watch your condition.   Will get help right away if you are not  doing well or get worse.  Document Released: 02/24/2005 Document Revised: 05/06/2011 Document Reviewed: 03/22/2011 [facility] Patient Information 2012 [place], [place].Hematoma A hematoma is a pocket of blood that collects under the skin, in an organ, in a body space, in a joint space, or in other tissue. The blood can clot to form a lump that you can see and feel. The lump is often firm, sore, and sometimes even painful and tender. Most hematomas get better in a few days to weeks. However, some hematomas may be serious and require medical care.Hematomas can range in size from very small to very large. CAUSES  A hematoma can be caused by a blunt or penetrating injury. It can also be caused by leakage from a blood vessel under the skin. Spontaneous leakage from a blood vessel is more likely to occur in elderly people, especially those taking blood thinners. Sometimes, a hematoma can develop after certain medical procedures. SYMPTOMS  Unlike a bruise, a hematoma forms a firm lump that you can feel. This lump is the collection of blood. The collection of blood can also cause your skin to turn a blue to dark blue color. If the hematoma is close to the surface of the skin, it often produces a yellowish color in the skin. DIAGNOSIS  Your caregiver can determine whether you have a hematoma based on your history and a physical exam. TREATMENT  Hematomas usually go away on their own over time. Rarely  does the blood need to be drained out of the body. HOME CARE INSTRUCTIONS   Put ice on the injured area.   Put ice in a plastic bag.   Place a towel between your skin and the bag.   Leave the ice on for 15 to 20 minutes, 3 to 4 times a day for the first 1 to 2 days.   After the first 2 days, switch to using warm compresses on the hematoma.   Elevate the injured area to help decrease pain and swelling. Wrapping the area with an elastic bandage may also be helpful. Compression helps to reduce swelling and  promotes shrinking of the hematoma. Make sure the bandage is not wrapped too tight.   If your hematoma is on a lower extremity and is painful, crutches may be helpful for a couple days.   Only take over-the-counter or prescription medicines for pain, discomfort, or fever as directed by your caregiver. Most patients can take acetaminophen or ibuprofen for the pain.  SEEK IMMEDIATE MEDICAL CARE IF:   You have increasing pain, or your pain is not controlled with medicine.   You have a fever.   You have worsening swelling or discoloration.   Your skin over the hematoma breaks or starts bleeding.  MAKE SURE YOU:   Understand these instructions.   Will watch your condition.   Will get help right away if you are not doing well or get worse.  Document Released: 12/30/2003 Document Revised: 05/06/2011 Document Reviewed: 01/18/2011 [facility] Patient Information 2012 [place], [place].

## 2011-08-26 NOTE — ED Provider Notes (Signed)
History     CSN: [id number]  Arrival date & time 08/26/11  1927   First MD Initiated Contact with Patient 08/26/11 1940      Chief Complaint  Patient presents with  . Fall  . Head Injury   level V caveat to 2 dementia.  (Consider location/radiation/quality/duration/timing/severity/associated sxs/prior treatment) Patient is a 78 y.o. female presenting with fall and head injury. The history is provided by the patient and the nursing home. The history is limited by the condition of the patient.  Fall  Head Injury    patient is a nursing home resident reportedly hit her head on a door falling. History of dementia. She states it hurts where the swelling is on her head. She is reportedly at her baseline dementia. She has a hematoma to her right scalp. She is a hematoma to her right elbow. No chest or abdominal pain she's not on any blood thinners  Past Medical History  Diagnosis Date  . Dementia   . Falls frequently     History reviewed. No pertinent past surgical history.  History reviewed. No pertinent family history.  History  Substance Use Topics  . Smoking status: Not on file  . Smokeless tobacco: Not on file  . Alcohol Use: No    OB History    Grav Para Term Preterm Abortions TAB SAB Ect Mult Living                  Review of Systems  Unable to perform ROS: Dementia    Allergies  Codeine; Penicillins; and Sulfonamide derivatives  Home Medications   Current Outpatient Rx  Name Route Sig Dispense Refill  . ASPIRIN 81 MG PO TABS Oral Take 81 mg by mouth daily.      Marland Kitchen HYDROCODONE-ACETAMINOPHEN 5-500 MG PO TABS Oral Take 1 tablet by mouth every 6 (six) hours as needed. For pain    . LEVOTHYROXINE SODIUM 75 MCG PO TABS Oral Take 75 mcg by mouth daily.      Marland Kitchen MEMANTINE HCL 10 MG PO TABS Oral Take 10 mg by mouth daily.     Marland Kitchen PRAVASTATIN SODIUM 40 MG PO TABS Oral Take 40 mg by mouth at bedtime.     Marland Kitchen QUETIAPINE FUMARATE 100 MG PO TABS Oral Take 100 mg by mouth at  bedtime.      Marland Kitchen VITAMIN B-1 100 MG PO TABS Oral Take 100 mg by mouth daily.        BP 195/60  Pulse 79  Temp(Src) 98.1 F (36.7 C) (Oral)  Resp 20  SpO2 99%  Physical Exam  Nursing note and vitals reviewed. Constitutional: She appears well-developed and well-nourished.  HENT:  Head: Normocephalic.       4 cm hematoma to right posterior occipital area. No bleeding. No crepitance.  Eyes: EOM are normal. Pupils are equal, round, and reactive to light.  Neck:        nontender. No step-off or deformity. Cervical collar is in place   Cardiovascular: Normal rate, regular rhythm and normal heart sounds.   No murmur heard. Pulmonary/Chest: Effort normal and breath sounds normal. No respiratory distress. She has no wheezes. She has no rales.  Abdominal: Soft. Bowel sounds are normal. She exhibits no distension. There is no tenderness. There is no rebound and no guarding.  Musculoskeletal:       Mild tenderness over right superior pelvis. Hematoma to right elbow. Nontender presents to the hematoma. Range of motion is intact. No tenderness over  shoulder or wrist but it.  Neurological: She is alert. No cranial nerve deficit.       Patient is pleasantly demented  Skin: Skin is warm and dry.  Psychiatric: She has a normal mood and affect. Her speech is normal.    ED Course  Procedures (including critical care time)  Labs Reviewed - No data to display Dg Pelvis 1-2 Views  08/26/2011  *RADIOLOGY REPORT*  Clinical Data: Left hip pain following a fall.  PELVIS - 1-2 VIEW  Comparison: 04/22/2011.  Findings: Stable proximal left femur fixation hardware.  Diffuse osteopenia.  No fracture or dislocation.  Stable lower lumbar spine compression deformities.  IMPRESSION:  1.  No acute abnormality. 2.  Stable lower lumbar spine compression deformities.  Original Report Authenticated By: Darrol Angel, M.D.   Dg Elbow Complete Right  08/26/2011  *RADIOLOGY REPORT*  Clinical Data: Right elbow pain and  swelling following a fall.  RIGHT ELBOW - COMPLETE 3+ VIEW  Comparison: None.  Findings: Large soft tissue mass in the lateral aspect of the proximal forearm.  No fracture, dislocation or effusion seen.  IMPRESSION: Large proximal forearm soft tissue hematoma.  No fracture.  Original Report Authenticated By: Darrol Angel, M.D.   Ct Head Wo Contrast  08/26/2011  *RADIOLOGY REPORT*  Clinical Data:  Larey Seat and hit head.  Dementia.  Head pain.  Neck pain.  CT HEAD WITHOUT CONTRAST CT CERVICAL SPINE WITHOUT CONTRAST  Technique:  Multidetector CT imaging of the head and cervical spine was performed following the standard protocol without intravenous contrast.  Multiplanar CT image reconstructions of the cervical spine were also generated.  Comparison:  CT head 03/18/2011  CT HEAD  Findings: There is no evidence for acute infarction, intracranial hemorrhage, mass lesion, hydrocephalus, or extra-axial fluid. Moderate atrophy.  Chronic microvascular ischemic change.  Large right parietal scalp hematoma.  No underlying skull fracture. Clear sinuses and mastoids.  Moderate vascular calcification.  No significant change from priors other than a scalp hematoma.  IMPRESSION: Atrophy and small vessel disease.  No intracranial hemorrhage or skull fracture.  Moderate sized right parietal scalp hematoma.  CT CERVICAL SPINE  Findings: Slight motion in the upper cervical region.  Overall study diagnostic.  There is no visible cervical spine fracture or traumatic subluxation.  Skeletal osteopenia is noted.  There is a moderate sized central disc extrusion at C4-C5, unchanged from priors. Mild facet arthropathy.  Carotid calcification.  Lung apices clear.  No upper rib fractures.  IMPRESSION: No cervical spine fracture or traumatic subluxation.  Central disc extrusion C4-C5 stable from 2012.  Original Report Authenticated By: Elsie Stain, M.D.   Ct Cervical Spine Wo Contrast  08/26/2011  *RADIOLOGY REPORT*  Clinical Data:  Larey Seat  and hit head.  Dementia.  Head pain.  Neck pain.  CT HEAD WITHOUT CONTRAST CT CERVICAL SPINE WITHOUT CONTRAST  Technique:  Multidetector CT imaging of the head and cervical spine was performed following the standard protocol without intravenous contrast.  Multiplanar CT image reconstructions of the cervical spine were also generated.  Comparison:  CT head 03/18/2011  CT HEAD  Findings: There is no evidence for acute infarction, intracranial hemorrhage, mass lesion, hydrocephalus, or extra-axial fluid. Moderate atrophy.  Chronic microvascular ischemic change.  Large right parietal scalp hematoma.  No underlying skull fracture. Clear sinuses and mastoids.  Moderate vascular calcification.  No significant change from priors other than a scalp hematoma.  IMPRESSION: Atrophy and small vessel disease.  No intracranial hemorrhage or  skull fracture.  Moderate sized right parietal scalp hematoma.  CT CERVICAL SPINE  Findings: Slight motion in the upper cervical region.  Overall study diagnostic.  There is no visible cervical spine fracture or traumatic subluxation.  Skeletal osteopenia is noted.  There is a moderate sized central disc extrusion at C4-C5, unchanged from priors. Mild facet arthropathy.  Carotid calcification.  Lung apices clear.  No upper rib fractures.  IMPRESSION: No cervical spine fracture or traumatic subluxation.  Central disc extrusion C4-C5 stable from 2012.  Original Report Authenticated By: Elsie Stain, M.D.     1. Fall   2. Scalp hematoma   3. Traumatic hematoma of forearm       MDM  Patient with falls. X-rays are negative. Head CT is negative. Doubt hip fracture. She'll be discharged back to followup as needed        Juliet Rude. Rubin Payor, MD 08/26/11 2202

## 2011-11-03 ENCOUNTER — Emergency Department (HOSPITAL_COMMUNITY)
Admission: EM | Admit: 2011-11-03 | Discharge: 2011-11-03 | Disposition: A | Discharge status: Home / Self Care | Payer: No Typology Code available for payment source | Attending: Emergency Medicine | Admitting: Emergency Medicine

## 2011-11-03 ENCOUNTER — Encounter (HOSPITAL_COMMUNITY): Payer: Self-pay | Admitting: Emergency Medicine

## 2011-11-03 DIAGNOSIS — M810 Age-related osteoporosis without current pathological fracture: Secondary | ICD-10-CM | POA: Insufficient documentation

## 2011-11-03 DIAGNOSIS — W19XXXA Unspecified fall, initial encounter: Secondary | ICD-10-CM

## 2011-11-03 DIAGNOSIS — Z9181 History of falling: Secondary | ICD-10-CM | POA: Insufficient documentation

## 2011-11-03 DIAGNOSIS — F0390 Unspecified dementia without behavioral disturbance: Secondary | ICD-10-CM | POA: Insufficient documentation

## 2011-11-03 DIAGNOSIS — Z008 Encounter for other general examination: Secondary | ICD-10-CM | POA: Insufficient documentation

## 2011-11-03 DIAGNOSIS — E785 Hyperlipidemia, unspecified: Secondary | ICD-10-CM | POA: Insufficient documentation

## 2011-11-03 DIAGNOSIS — E039 Hypothyroidism, unspecified: Secondary | ICD-10-CM | POA: Insufficient documentation

## 2011-11-03 HISTORY — DX: Dorsalgia, unspecified: M54.9

## 2011-11-03 HISTORY — DX: Hypothyroidism, unspecified: E03.9

## 2011-11-03 HISTORY — DX: Age-related osteoporosis without current pathological fracture: M81.0

## 2011-11-03 HISTORY — DX: Atherosclerotic heart disease of native coronary artery without angina pectoris: I25.10

## 2011-11-03 HISTORY — DX: Hyperlipidemia, unspecified: E78.5

## 2011-11-03 HISTORY — DX: Meniere's disease, unspecified ear: H81.09

## 2011-11-03 NOTE — ED Provider Notes (Signed)
I have personally seen and examined the patient.  I have discussed the plan of care with the resident.  I have reviewed the documentation on PMH/FH/Soc. History.  I have reviewed the documentation of the resident and agree.  Pt well appearing, pleasantly demented, ranged all extremities and no tenderness, at her baseline per family, no signs of head trauma, stable for d/c  BP 117/52  Pulse 77  Temp(Src) 97.9 F (36.6 C) (Oral)  Resp 20  Ht 5\' 5"  (1.651 m)  Wt 150 lb (68.04 kg)  BMI 24.96 kg/m2  SpO2 96%   Joya Gaskins, MD 11/03/11 2205

## 2011-11-03 NOTE — ED Provider Notes (Signed)
History     CSN: [id number]  Arrival date & time 11/03/11  2018   First MD Initiated Contact with Patient 11/03/11 2026      Chief Complaint  Patient presents with  . Fall    (Consider location/radiation/quality/duration/timing/severity/associated sxs/prior treatment) HPI Comments: 78 yo female with dementia and recurrent falls presents from SNF after two falls today. The first she bumped her head reportedly, then later she was found again on the floor and was un-witnessed fall. No reports of AMS from SNF. Patient is poor historian, does not recall circumstance of fall, denies any pain currently.   Patient is a 78 y.o. female presenting with fall.  Fall    Past Medical History  Diagnosis Date  . Dementia   . Falls frequently   . CAD (coronary artery disease)   . Hypothyroidism   . Meniere disease   . Hyperlipidemia   . Osteoporosis   . Back pain     History reviewed. No pertinent past surgical history.  History reviewed. No pertinent family history.  History  Substance Use Topics  . Smoking status: Not on file  . Smokeless tobacco: Not on file  . Alcohol Use: No    OB History    Grav Para Term Preterm Abortions TAB SAB Ect Mult Living                  Review of Systems  Unable to perform ROS: Dementia    Allergies  Codeine; Penicillins; and Sulfonamide derivatives  Home Medications   Current Outpatient Rx  Name Route Sig Dispense Refill  . ACETAMINOPHEN 500 MG PO TABS Oral Take 500 mg by mouth every 4 (four) hours as needed. For pain    . ASPIRIN 81 MG PO TABS Oral Take 81 mg by mouth daily.      Marland Kitchen LEVOTHYROXINE SODIUM 75 MCG PO TABS Oral Take 75 mcg by mouth daily.      Marland Kitchen MEMANTINE HCL 10 MG PO TABS Oral Take 10 mg by mouth 2 (two) times daily.     Marland Kitchen PRAVASTATIN SODIUM 40 MG PO TABS Oral Take 40 mg by mouth at bedtime.     Marland Kitchen QUETIAPINE FUMARATE 100 MG PO TABS Oral Take 100 mg by mouth at bedtime.      Marland Kitchen VITAMIN B-1 100 MG PO TABS Oral Take 100 mg by  mouth daily.        BP 117/52  Pulse 77  Temp(Src) 97.9 F (36.6 C) (Oral)  Resp 20  Ht 5\' 5"  (1.651 m)  Wt 150 lb (68.04 kg)  BMI 24.96 kg/m2  SpO2 96%  Physical Exam  Vitals reviewed. Constitutional: She is oriented to person, place, and time. She appears well-developed and well-nourished. No distress.  HENT:  Head: Normocephalic and atraumatic.  Right Ear: External ear normal.  Left Ear: External ear normal.  Nose: Nose normal.  Mouth/Throat: Oropharynx is clear and moist. No oropharyngeal exudate.  Eyes: EOM are normal. Pupils are equal, round, and reactive to light.  Neck: Normal range of motion. Neck supple.  Cardiovascular: Normal rate, regular rhythm and normal heart sounds.   Pulmonary/Chest: Effort normal and breath sounds normal.  Abdominal: Soft. Bowel sounds are normal. She exhibits no distension. There is no tenderness. There is no rebound.  Musculoskeletal: Normal range of motion. She exhibits no edema and no tenderness.       No TTP in spinal column, hips, knees, ankles, elbows, wrists, shoulders, pelvis. No scalp lesions or bruising.  Neurological: She is alert and oriented to person, place, and time. She exhibits normal muscle tone. Coordination normal.       Moves all extremities appropriately. Speech is fluent but tangential.   Skin: No rash noted. She is not diaphoretic. No erythema.  Psychiatric:       Normal affect. Disoriented. Speech is nonsensical at times, answers yes and no questions, responds appropriately to commands.    ED Course  Procedures (including critical care time)  Labs Reviewed - No data to display No results found.   1. Fall       MDM  78 yo female with dementia and recurrent falls. Two episodes today. No sign of head trauma or injury on exam.Cleared from c-collar and backboard.  Patient denies any pain. Will ambulate to assess for any functional impairments and monitor neurologic status.    9:21 PM Patient ambulates in  hallway without problem. She seems to favor left side-this is chronic due to history of hip replacement in the past. Daughter and son-in-law present at bedside and feel comfortable returning patient to SNF tonight. They deny any recent changes in mental status, no new meds, and patient was very well when they visited yesterday. No concerns currently. Will DC patient back to skilled nursing, advised to RTC for any decline in mental status or development of pain.      Durwin Reges, MD 11/03/11 2123

## 2011-11-03 NOTE — ED Notes (Signed)
Patient from [facility] with two unwitnessed falls today. Denies pain. Patient is on backboard with c-collar.

## 2011-11-03 NOTE — Discharge Instructions (Signed)
Uncertain of cause for repeated falls. Could be combination of things: dementia, medications, imbalance, aging, trips. If patient has any pain, change in mental status, speech, vomiting, fever then bring her back to ED. Have the doctor see patient in next 1-2 days to assess for medication safety.  Fall Prevention, Elderly Falls are the leading cause of injuries, accidents, and accidental deaths in people over the age of 51. Falling is a real threat to your ability to live on your own. CAUSES   Poor eyesight or poor hearing can make you more likely to fall.   Illnesses and physical conditions can affect your strength and balance.   Poor lighting, throw rugs and pets in your home can make you more likely to trip or slip.   The side effects of some medicines can upset your balance and lead to falling. These include medicines for depression, sleep problems, high blood pressure, diabetes, and heart conditions.  PREVENTION  Be sure your home is as safe as possible. Here are some tips:  Wear shoes with non-skid soles (not house slippers).   Be sure your home and outside area are well lit.   Use night lights throughout your house, including hallways and stairways.   Remove clutter and clean up spills on floors and walkways.   Remove throw rugs or fasten them to the floor with carpet tape. Tack down carpet edges.   Do not place electrical cords across pathways.   Install grab bars in your bathtub, shower, and toilet area. Towel bars should not be used as a grab bar.   Install handrails on both sides of stairways.   Do not climb on stools or stepladders. Get someone else to help with jobs that require climbing.   Do not wax your floors at all, or use a non-skid wax.   Repair uneven or unsafe sidewalks, walkways or stairs.   Keep frequently used items within reach.   Be aware of pets so you do not trip.  Get regular check-ups from your doctor, and take good care of yourself:  Have  your eyes checked every year for vision changes, cataracts, glaucoma, and other eye problems. Wear eyeglasses as directed.   Have your hearing checked every 2 years, or anytime you or others think that you cannot hear well. Use hearing aids as directed.   See your caregiver if you have foot pain or corns. Sore feet can contribute to falls.   Let your caregiver know if a medicine is making you feel dizzy or making you lose your balance.   Use a cane, walker, or wheelchair as directed. Use walker or wheelchair brakes when getting in and out.   When you get up from bed, sit on the side of the bed for 1 to 2 minutes before you stand up. This will give your blood pressure time to adjust, and you will feel less dizzy.   If you need to go to the bathroom often, consider using a bedside commode.  Keep your body in good shape:  Get regular exercise, especially walking.   Do exercises to strengthen the muscles you use for walking and lifting.   Do not smoke.   Minimize use of alcohol.  SEEK IMMEDIATE MEDICAL CARE IF:   You feel dizzy, weak, or unsteady on your feet.   You feel confused.   You fall.  Document Released: 05/17/2005 Document Revised: 05/06/2011 Document Reviewed: 11/11/2006 [facility] Patient Information 2012 [place], [place].

## 2012-03-29 ENCOUNTER — Encounter (HOSPITAL_COMMUNITY): Payer: Self-pay | Admitting: *Deleted

## 2012-03-29 ENCOUNTER — Emergency Department (HOSPITAL_COMMUNITY): Payer: Medicare Other

## 2012-03-29 ENCOUNTER — Emergency Department (HOSPITAL_COMMUNITY)
Admission: EM | Admit: 2012-03-29 | Discharge: 2012-03-30 | Disposition: A | Discharge status: Home / Self Care | Payer: Medicare Other | Attending: Emergency Medicine | Admitting: Emergency Medicine

## 2012-03-29 DIAGNOSIS — M25559 Pain in unspecified hip: Secondary | ICD-10-CM | POA: Insufficient documentation

## 2012-03-29 DIAGNOSIS — Z8669 Personal history of other diseases of the nervous system and sense organs: Secondary | ICD-10-CM | POA: Insufficient documentation

## 2012-03-29 DIAGNOSIS — E039 Hypothyroidism, unspecified: Secondary | ICD-10-CM | POA: Insufficient documentation

## 2012-03-29 DIAGNOSIS — E785 Hyperlipidemia, unspecified: Secondary | ICD-10-CM | POA: Insufficient documentation

## 2012-03-29 DIAGNOSIS — Z8739 Personal history of other diseases of the musculoskeletal system and connective tissue: Secondary | ICD-10-CM | POA: Insufficient documentation

## 2012-03-29 DIAGNOSIS — Z79899 Other long term (current) drug therapy: Secondary | ICD-10-CM | POA: Insufficient documentation

## 2012-03-29 DIAGNOSIS — I2510 Atherosclerotic heart disease of native coronary artery without angina pectoris: Secondary | ICD-10-CM | POA: Insufficient documentation

## 2012-03-29 DIAGNOSIS — M810 Age-related osteoporosis without current pathological fracture: Secondary | ICD-10-CM | POA: Insufficient documentation

## 2012-03-29 DIAGNOSIS — F0390 Unspecified dementia without behavioral disturbance: Secondary | ICD-10-CM | POA: Insufficient documentation

## 2012-03-29 DIAGNOSIS — Z9181 History of falling: Secondary | ICD-10-CM | POA: Insufficient documentation

## 2012-03-29 DIAGNOSIS — Z7982 Long term (current) use of aspirin: Secondary | ICD-10-CM | POA: Insufficient documentation

## 2012-03-29 NOTE — ED Notes (Signed)
Pt to department via EMS.  Per report pt has pain in left hip.  Staff reported to EMS that she has not had any falls that they are aware of.  Pt denies pain at present time, but grimaces with movement.  Pt does have history of dementia.

## 2012-03-29 NOTE — ED Notes (Signed)
Patient transported to X-ray 

## 2012-03-30 NOTE — ED Notes (Signed)
Pt resting quietly, no distress noted.  Family at bedside.

## 2012-03-30 NOTE — ED Provider Notes (Signed)
History     CSN: [id number]  Arrival date & time 03/29/12  2242   First MD Initiated Contact with Patient 03/29/12 2354      Chief Complaint  Patient presents with  . Hip Pain    (Consider location/radiation/quality/duration/timing/severity/associated sxs/prior treatment) HPI Level 5 caveat due to dementia [name] [name] is a 78 y.o. female, SNF resident  brought in by ambulance, who presents to the Emergency Department complaining of right hip pain. Patient is unable to provide any history. Per daughter, nursing home reported she was favoring her right hip today. She has had a previous hip replacement.  PCP Dr. Janna Arch  Past Medical History  Diagnosis Date  . Dementia   . Falls frequently   . CAD (coronary artery disease)   . Hypothyroidism   . Meniere disease   . Hyperlipidemia   . Osteoporosis   . Back pain     History reviewed. No pertinent past surgical history.  History reviewed. No pertinent family history.  History  Substance Use Topics  . Smoking status: Not on file  . Smokeless tobacco: Not on file  . Alcohol Use: No    OB History    Grav Para Term Preterm Abortions TAB SAB Ect Mult Living                  Review of Systems  Unable to perform ROS: Dementia    Allergies  Codeine; Penicillins; and Sulfonamide derivatives  Home Medications   Current Outpatient Rx  Name Route Sig Dispense Refill  . ACETAMINOPHEN 500 MG PO TABS Oral Take 500 mg by mouth every 4 (four) hours as needed. For pain    . ASPIRIN 81 MG PO TABS Oral Take 81 mg by mouth daily.      Marland Kitchen LEVOTHYROXINE SODIUM 75 MCG PO TABS Oral Take 75 mcg by mouth daily.      Marland Kitchen MEMANTINE HCL 10 MG PO TABS Oral Take 10 mg by mouth 2 (two) times daily.     Marland Kitchen PRAVASTATIN SODIUM 40 MG PO TABS Oral Take 40 mg by mouth at bedtime.     Marland Kitchen VITAMIN B-1 100 MG PO TABS Oral Take 100 mg by mouth daily.        BP 149/59  Pulse 75  Temp 97.9 F (36.6 C) (Oral)  Resp 18  Ht 5\' 5"  (1.651 m)  Wt 130  lb (58.968 kg)  BMI 21.63 kg/m2  SpO2 96%  Physical Exam  Nursing note and vitals reviewed. Constitutional: She appears well-developed and well-nourished.       Awake, alert, nontoxic appearance.  HENT:  Head: Atraumatic.  Eyes: Right eye exhibits no discharge. Left eye exhibits no discharge.  Neck: Neck supple.  Cardiovascular: Normal heart sounds.   Pulmonary/Chest: Effort normal and breath sounds normal. She exhibits no tenderness.  Abdominal: Soft. There is no tenderness. There is no rebound.  Musculoskeletal: She exhibits no tenderness.       Baseline ROM, no obvious new focal weakness.FROM at both hips  Neurological:       Mental status and motor strength appears baseline for patient and situation.  Skin: No rash noted.       calluses to bottom of both feet  Psychiatric: She has a normal mood and affect.    ED Course  Procedures (including critical care time)  Labs Reviewed - No data to display Dg Hip Complete Left  03/29/2012  *RADIOLOGY REPORT*  Clinical Data: Fall.  Left hip pain.  LEFT HIP - COMPLETE 2+ VIEW  Comparison: Plain films 08/26/2011.  Findings: Dynamic hip screw for fixation of an old healed intertrochanteric fracture is again identified.  Hardware is intact.  There is no fracture.  Both hips are located.  IMPRESSION: No acute finding.   Original Report Authenticated By: Bernadene Bell. Maricela Curet, M.D.      No diagnosis found.    MDM  SNF resident here with right hip pain. Xrays negative for acute findings.  Reviewed results with the patient's daughter. Patient to be returned to facility.Pt stable in ED with no significant deterioration in condition.The patient appears reasonably screened and/or stabilized for discharge and I doubt any other medical condition or other [facility] requiring further screening, evaluation, or treatment in the ED at this time prior to discharge.  MDM Reviewed: nursing note and vitals Interpretation: x-ray           Nicoletta Dress.  Colon Branch, MD 03/30/12 [phone]

## 2012-03-30 NOTE — ED Notes (Signed)
Report called to [facility].  Pt to be discharged, daughter will transport back to house.

## 2012-09-01 ENCOUNTER — Emergency Department (HOSPITAL_COMMUNITY)
Admission: EM | Admit: 2012-09-01 | Discharge: 2012-09-01 | Disposition: A | Discharge status: Skilled Nursing Facility | Payer: Medicare Other | Attending: Emergency Medicine | Admitting: Emergency Medicine

## 2012-09-01 ENCOUNTER — Emergency Department (HOSPITAL_COMMUNITY): Payer: Medicare Other

## 2012-09-01 ENCOUNTER — Telehealth: Payer: Self-pay | Admitting: Orthopedic Surgery

## 2012-09-01 ENCOUNTER — Encounter (HOSPITAL_COMMUNITY): Payer: Self-pay | Admitting: *Deleted

## 2012-09-01 DIAGNOSIS — Z7982 Long term (current) use of aspirin: Secondary | ICD-10-CM | POA: Insufficient documentation

## 2012-09-01 DIAGNOSIS — S22000A Wedge compression fracture of unspecified thoracic vertebra, initial encounter for closed fracture: Secondary | ICD-10-CM

## 2012-09-01 DIAGNOSIS — Z882 Allergy status to sulfonamides status: Secondary | ICD-10-CM | POA: Insufficient documentation

## 2012-09-01 DIAGNOSIS — S22009A Unspecified fracture of unspecified thoracic vertebra, initial encounter for closed fracture: Secondary | ICD-10-CM | POA: Insufficient documentation

## 2012-09-01 DIAGNOSIS — E785 Hyperlipidemia, unspecified: Secondary | ICD-10-CM | POA: Insufficient documentation

## 2012-09-01 DIAGNOSIS — Z8679 Personal history of other diseases of the circulatory system: Secondary | ICD-10-CM | POA: Insufficient documentation

## 2012-09-01 DIAGNOSIS — Z79899 Other long term (current) drug therapy: Secondary | ICD-10-CM | POA: Insufficient documentation

## 2012-09-01 DIAGNOSIS — Y9210 Unspecified residential institution as the place of occurrence of the external cause: Secondary | ICD-10-CM | POA: Insufficient documentation

## 2012-09-01 DIAGNOSIS — H8109 Meniere's disease, unspecified ear: Secondary | ICD-10-CM | POA: Insufficient documentation

## 2012-09-01 DIAGNOSIS — F0390 Unspecified dementia without behavioral disturbance: Secondary | ICD-10-CM | POA: Insufficient documentation

## 2012-09-01 DIAGNOSIS — Y939 Activity, unspecified: Secondary | ICD-10-CM | POA: Insufficient documentation

## 2012-09-01 DIAGNOSIS — E039 Hypothyroidism, unspecified: Secondary | ICD-10-CM | POA: Insufficient documentation

## 2012-09-01 DIAGNOSIS — M810 Age-related osteoporosis without current pathological fracture: Secondary | ICD-10-CM | POA: Insufficient documentation

## 2012-09-01 DIAGNOSIS — Z9181 History of falling: Secondary | ICD-10-CM | POA: Insufficient documentation

## 2012-09-01 DIAGNOSIS — Z8739 Personal history of other diseases of the musculoskeletal system and connective tissue: Secondary | ICD-10-CM | POA: Insufficient documentation

## 2012-09-01 DIAGNOSIS — Z880 Allergy status to penicillin: Secondary | ICD-10-CM | POA: Insufficient documentation

## 2012-09-01 DIAGNOSIS — Z885 Allergy status to narcotic agent status: Secondary | ICD-10-CM | POA: Insufficient documentation

## 2012-09-01 DIAGNOSIS — W06XXXA Fall from bed, initial encounter: Secondary | ICD-10-CM | POA: Insufficient documentation

## 2012-09-01 MED ORDER — OXYCODONE-ACETAMINOPHEN 5-325 MG PO TABS: 1.0000 | ORAL_TABLET | ORAL | Status: DC | PRN

## 2012-09-01 MED ORDER — ONDANSETRON 8 MG PO TBDP
8.0000 mg | ORAL_TABLET | Freq: Once | ORAL | Status: AC
  Administered 2012-09-01: 8 mg via ORAL
  Filled 2012-09-01: qty 1

## 2012-09-01 MED ORDER — OXYCODONE-ACETAMINOPHEN 5-325 MG PO TABS
ORAL_TABLET | ORAL | Status: AC
  Filled 2012-09-01: qty 1

## 2012-09-01 MED ORDER — HYDROMORPHONE HCL PF 1 MG/ML IJ SOLN
1.0000 mg | Freq: Once | INTRAMUSCULAR | Status: AC
  Administered 2012-09-01: 1 mg via INTRAMUSCULAR
  Filled 2012-09-01: qty 1

## 2012-09-01 NOTE — ED Notes (Signed)
Pt would not take pill. Advised daughter would ask edp for IM injection. Pt hollars a lot with moving.

## 2012-09-01 NOTE — Telephone Encounter (Signed)
Patient's daughter stopped in to request appointment following Emergency Room visit, treatment following fall.  Please review the following-- Copied and pasted from Emergency room physician notes:   "Thoracic spine x-ray shows significant compression of T10. She'll be sent for CT to evaluate for possible retropulsion.  CT shows no retropulsion. Family member has arrived and state that patient has been ambulatory and in the recent past but has had a general decline with decreasing oral intake. She has been seen in the past for compression fractures and daughter thinks that she had kyphoplasty done. Old records are reviewed and she has had several ED visits for falls. She also had been evaluated and treated by Dr. Romeo Apple in the past-4 compression fractures of L2 and L4. She is referred back to Dr. Romeo Apple for followup and is to send back to her nursing care facility with prescription for Percocet for pain."  Patient resides at [facility]. Daughter, Otho Najjar, Operating Room Services #[phone] Please advise regarding appointment (also please review schedule).

## 2012-09-01 NOTE — Telephone Encounter (Signed)
Ok place in the approprate spot   Follow rules for appointments

## 2012-09-01 NOTE — ED Notes (Signed)
Pt sleeping chest rise and fall

## 2012-09-01 NOTE — ED Notes (Signed)
Pt arrived by EMS from the [facility]. Reported pt was found in floor. Pt w/ hx of dementia. Pt unable to states anything is hurting. Noted bruising to the left middle finger & bruise noted to the right elbow which appears to be old.

## 2012-09-01 NOTE — ED Provider Notes (Signed)
History    This chart was scribed for Dione Booze, MD by Charolett Bumpers, ED Scribe. The patient was seen in room APA04/APA04. Patient's care was started at 0703.    CSN: [id number]  Arrival date & time 09/01/12  4098   First MD Initiated Contact with Patient 09/01/12 0703      Chief Complaint  Patient presents with  . Fall   Level V Caveat: Dementia  The history is provided by the patient. The history is limited by the condition of the patient. No language interpreter was used.   [name] [name] is a 79 y.o. female who has a h/o dementia and frequent falls presents to the Emergency Department complaining of a un-witnessed fall that occurred some time last night. She arrived by EMS from [facility]. She was reportedly found on the floor this morning. She is unable to voice any complaints and does not answer questions due to h/o dementia.    Past Medical History  Diagnosis Date  . Dementia   . Falls frequently   . CAD (coronary artery disease)   . Hypothyroidism   . Meniere disease   . Hyperlipidemia   . Osteoporosis   . Back pain     No past surgical history on file.  No family history on file.  History  Substance Use Topics  . Smoking status: Not on file  . Smokeless tobacco: Not on file  . Alcohol Use: No    OB History   Grav Para Term Preterm Abortions TAB SAB Ect Mult Living                  Review of Systems  Unable to perform ROS: Dementia    Allergies  Codeine; Penicillins; and Sulfonamide derivatives  Home Medications   Current Outpatient Rx  Name  Route  Sig  Dispense  Refill  . acetaminophen (TYLENOL) 500 MG tablet   Oral   Take 500 mg by mouth every 4 (four) hours as needed. For pain         . aspirin 81 MG tablet   Oral   Take 81 mg by mouth daily.           Marland Kitchen levothyroxine (SYNTHROID, LEVOTHROID) 75 MCG tablet   Oral   Take 75 mcg by mouth daily.           . memantine (NAMENDA) 10 MG tablet   Oral   Take 10 mg by  mouth 2 (two) times daily.          . pravastatin (PRAVACHOL) 40 MG tablet   Oral   Take 40 mg by mouth at bedtime.          . Thiamine HCl (VITAMIN B-1) 100 MG tablet   Oral   Take 100 mg by mouth daily.             BP 127/68  Pulse 72  Temp(Src) 99 F (37.2 C) (Rectal)  Resp 16  Wt 135 lb (61.236 kg)  BMI 22.47 kg/m2  SpO2 93%  Physical Exam  Nursing note and vitals reviewed. Constitutional: She appears well-developed and well-nourished. No distress.  HENT:  Head: Normocephalic and atraumatic.  Eyes: EOM are normal. Pupils are equal, round, and reactive to light.  Neck: Normal range of motion. Neck supple. No tracheal deviation present.  Cardiovascular: Normal rate, regular rhythm and normal heart sounds.   Pulmonary/Chest: Effort normal and breath sounds normal. No respiratory distress.  Abdominal: Soft. She exhibits no  distension.  Musculoskeletal: She exhibits no edema.  Moderate tenderness to the thoracic spine. Pain with ROM of left hip. Bruise noted to left third finger.   Neurological: She is alert.  Does not answer questions, but does respond to pain with some verbal response. Marked cogwheel rigidity .   Skin: Skin is warm and dry.    ED Course  Procedures (including critical care time)  DIAGNOSTIC STUDIES: Oxygen Saturation is 93% on room air, adequate by my interpretation.    COORDINATION OF CARE:  7:10 AM-Will order CT of head and c-spine and x-rays of thoracic spine, left hip and left hand.    Labs Reviewed - No data to display Dg Thoracic Spine 2 View  09/01/2012  *RADIOLOGY REPORT*  Clinical Data: Recent fall, combative, dimension  THORACIC SPINE - 2 VIEW  Comparison: Chest x-ray of 09/25/2010  Findings: The bones are very osteopenic.  There are compression fractures of T10 and L2 and L4 vertebral bodies that appear new compared to the prior lateral chest x-ray from April 2012.   There is slight soft tissue prominence at the level of the T10  compression deformity and an acute process is a definite consideration.  Normal alignment is maintained.  IMPRESSION:  1.  Compression deformity of T10 vertebral body with some soft tissue prominence adjacent, possibly acute. 2.  Compression deformity of L2 and to a lesser degree L4 vertebral bodies of questionable age.   Original Report Authenticated By: Dwyane Dee, M.D.    Dg Hip Complete Left  09/01/2012  *RADIOLOGY REPORT*  Clinical Data: Recent fall, combative, dementia  LEFT HIP - COMPLETE 2+ VIEW  Comparison: Pelvis and left hip films of10/30/2013  Findings: The bones are diffusely osteopenic.  Prior left hip pinning is noted.  No acute fracture is seen.  The pelvic rami appear intact and the SI joints are unremarkable.  IMPRESSION: Osteopenia.  No acute fracture.  Prior left hip pinning.   Original Report Authenticated By: Dwyane Dee, M.D.    Ct Head Wo Contrast  09/01/2012  *RADIOLOGY REPORT*  Clinical Data:  Recent fall  CT HEAD WITHOUT CONTRAST CT CERVICAL SPINE WITHOUT CONTRAST  Technique:  Multidetector CT imaging of the head and cervical spine was performed following the standard protocol without intravenous contrast.  Multiplanar CT image reconstructions of the cervical spine were also generated.  Comparison:  CT head and cervical spine of 08/26/2011  CT HEAD  Findings: The ventricular system remains prominent as are the cortical sulci, indicative of diffuse atrophy.  The septum is midline in position.  The fourth ventricle basilar cisterns are unremarkable.  Mild small vessel ischemic change is noted.  No hemorrhage, mass lesion, or acute infarction is seen.  On bone window images no acute calvarial fracture is seen.  The paranasal sinuses are pneumatized.  IMPRESSION: Atrophy and small vessel disease.  No acute intracranial abnormality.  CT CERVICAL SPINE  Findings: The cervical vertebrae are in normal alignment.  The bones are diffusely osteopenic.  No acute fracture is seen.  There is  degenerative change throughout the facet joints.  No prevertebral soft tissue swelling is seen.  The odontoid process is intact the lung apices are clear.  Multilevel central disc bulges are present, the largest at the C4-5 level of questionable significance.  The thyroid gland is unremarkable  IMPRESSION: . 1.  Normal alignment with no acute cervical spine fracture. 2.  Diffuse osteopenia. 3.  Multilevel central disc bulges, largest at C4-5.  Correlate clinically.  Original Report Authenticated By: Dwyane Dee, M.D.    Ct Cervical Spine Wo Contrast  09/01/2012  *RADIOLOGY REPORT*  Clinical Data:  Recent fall  CT HEAD WITHOUT CONTRAST CT CERVICAL SPINE WITHOUT CONTRAST  Technique:  Multidetector CT imaging of the head and cervical spine was performed following the standard protocol without intravenous contrast.  Multiplanar CT image reconstructions of the cervical spine were also generated.  Comparison:  CT head and cervical spine of 08/26/2011  CT HEAD  Findings: The ventricular system remains prominent as are the cortical sulci, indicative of diffuse atrophy.  The septum is midline in position.  The fourth ventricle basilar cisterns are unremarkable.  Mild small vessel ischemic change is noted.  No hemorrhage, mass lesion, or acute infarction is seen.  On bone window images no acute calvarial fracture is seen.  The paranasal sinuses are pneumatized.  IMPRESSION: Atrophy and small vessel disease.  No acute intracranial abnormality.  CT CERVICAL SPINE  Findings: The cervical vertebrae are in normal alignment.  The bones are diffusely osteopenic.  No acute fracture is seen.  There is degenerative change throughout the facet joints.  No prevertebral soft tissue swelling is seen.  The odontoid process is intact the lung apices are clear.  Multilevel central disc bulges are present, the largest at the C4-5 level of questionable significance.  The thyroid gland is unremarkable  IMPRESSION: . 1.  Normal alignment with  no acute cervical spine fracture. 2.  Diffuse osteopenia. 3.  Multilevel central disc bulges, largest at C4-5.  Correlate clinically.   Original Report Authenticated By: Dwyane Dee, M.D.    Dg Hand Complete Left  09/01/2012  *RADIOLOGY REPORT*  Clinical Data: Recent fall, combative, dementia  LEFT HAND - COMPLETE 3+ VIEW  Comparison: None.  Findings: The radiocarpal joint space appears normal and the carpal bones are in normal position.  The bones are diffusely osteopenic. However, no acute fracture is seen.  IMPRESSION: Osteopenia and degenerative change.  No acute fracture.   Original Report Authenticated By: Dwyane Dee, M.D.    Images viewed by me.  1. Fall from bed, initial encounter   2. Compression fracture of thoracic vertebra, closed, initial encounter       MDM  Fall with tenderness elicited in the thoracic spine and left hip. Bruise of the left hand of uncertain duration. X-rays of been ordered.  Thoracic spine x-ray shows significant compression of T10. She'll be sent for CT to evaluate for possible retropulsion.  CT shows no retropulsion. Family member has arrived and state that patient has been ambulatory and in the recent past but has had a general decline with decreasing oral intake. She has been seen in the past for compression fractures and daughter thinks that she had kyphoplasty done. Old records are reviewed and she has had several ED visits for falls. She also had been evaluated and treated by Dr. Romeo Apple in the past-4 compression fractures of L2 and L4. She is referred back to Dr. Romeo Apple for followup and is to send back to her nursing care facility with prescription for Percocet for pain.   I personally performed the services described in this documentation, which was scribed in my presence. The recorded information has been reviewed and is accurate.      Dione Booze, MD 09/01/12 1025

## 2012-09-04 NOTE — Telephone Encounter (Signed)
Spoke with patient's daughter - states "not really a rush right now, patient is comfortable at [facility]; scheduled first available appointment with "wait list" note in event an appointment comes available sooner.

## 2012-09-04 NOTE — Telephone Encounter (Signed)
Called patient's daughter to offer appointment for this afternoon (time opened up); reached ans.machine at cell# [phone].  Also called [id number] where patient resides, reached [facility].  They would not be able to send anyone with patient, and daughter needs to be with patient as well.  Try back to daughter if no response back this afternoon.

## 2012-09-09 ENCOUNTER — Emergency Department (HOSPITAL_COMMUNITY): Payer: Medicare Other

## 2012-09-09 ENCOUNTER — Emergency Department (HOSPITAL_COMMUNITY)
Admission: EM | Admit: 2012-09-09 | Discharge: 2012-09-09 | Disposition: A | Discharge status: Home / Self Care | Payer: Medicare Other | Attending: Emergency Medicine | Admitting: Emergency Medicine

## 2012-09-09 ENCOUNTER — Encounter (HOSPITAL_COMMUNITY): Payer: Self-pay | Admitting: Emergency Medicine

## 2012-09-09 DIAGNOSIS — Y9210 Unspecified residential institution as the place of occurrence of the external cause: Secondary | ICD-10-CM | POA: Insufficient documentation

## 2012-09-09 DIAGNOSIS — M549 Dorsalgia, unspecified: Secondary | ICD-10-CM | POA: Insufficient documentation

## 2012-09-09 DIAGNOSIS — E785 Hyperlipidemia, unspecified: Secondary | ICD-10-CM | POA: Insufficient documentation

## 2012-09-09 DIAGNOSIS — S0990XA Unspecified injury of head, initial encounter: Secondary | ICD-10-CM

## 2012-09-09 DIAGNOSIS — F0390 Unspecified dementia without behavioral disturbance: Secondary | ICD-10-CM | POA: Insufficient documentation

## 2012-09-09 DIAGNOSIS — H8109 Meniere's disease, unspecified ear: Secondary | ICD-10-CM | POA: Insufficient documentation

## 2012-09-09 DIAGNOSIS — Y9389 Activity, other specified: Secondary | ICD-10-CM | POA: Insufficient documentation

## 2012-09-09 DIAGNOSIS — I2510 Atherosclerotic heart disease of native coronary artery without angina pectoris: Secondary | ICD-10-CM | POA: Insufficient documentation

## 2012-09-09 DIAGNOSIS — M542 Cervicalgia: Secondary | ICD-10-CM

## 2012-09-09 DIAGNOSIS — W06XXXA Fall from bed, initial encounter: Secondary | ICD-10-CM | POA: Insufficient documentation

## 2012-09-09 DIAGNOSIS — M810 Age-related osteoporosis without current pathological fracture: Secondary | ICD-10-CM | POA: Insufficient documentation

## 2012-09-09 DIAGNOSIS — Z79899 Other long term (current) drug therapy: Secondary | ICD-10-CM | POA: Insufficient documentation

## 2012-09-09 DIAGNOSIS — Z880 Allergy status to penicillin: Secondary | ICD-10-CM | POA: Insufficient documentation

## 2012-09-09 DIAGNOSIS — W19XXXA Unspecified fall, initial encounter: Secondary | ICD-10-CM

## 2012-09-09 DIAGNOSIS — E039 Hypothyroidism, unspecified: Secondary | ICD-10-CM | POA: Insufficient documentation

## 2012-09-09 DIAGNOSIS — Z7982 Long term (current) use of aspirin: Secondary | ICD-10-CM | POA: Insufficient documentation

## 2012-09-09 DIAGNOSIS — Z9181 History of falling: Secondary | ICD-10-CM | POA: Insufficient documentation

## 2012-09-09 NOTE — ED Provider Notes (Signed)
History     CSN: [id number]  Arrival date & time 09/09/12  1724   First MD Initiated Contact with Patient 09/09/12 1731      Chief Complaint  Patient presents with  . Fall    The history is provided by medical records and the EMS personnel. History limited by: Level V caveat: Dementia.   the patient was reported to have fallen out of her bed at the nursing home.  She has dementia and is on the dementia unit.  This was not a witnessed fall.  She is not on anticoagulants.  EMS reports she was complaining of pain in her head and of her neck.  She has a known thoracic compression fracture.  She denies pain in her hips.  Nursing staff reports baseline mental status at this time.  No reported vomiting.  Normal health over the past several days per staff.  Past Medical History  Diagnosis Date  . Dementia   . Falls frequently   . CAD (coronary artery disease)   . Hypothyroidism   . Meniere disease   . Hyperlipidemia   . Osteoporosis   . Back pain     History reviewed. No pertinent past surgical history.  History reviewed. No pertinent family history.  History  Substance Use Topics  . Smoking status: Not on file  . Smokeless tobacco: Not on file  . Alcohol Use: No    OB History   Grav Para Term Preterm Abortions TAB SAB Ect Mult Living                  Review of Systems  Unable to perform ROS: Dementia    Allergies  Codeine; Penicillins; and Sulfonamide derivatives  Home Medications   Current Outpatient Rx  Name  Route  Sig  Dispense  Refill  . acetaminophen (TYLENOL) 500 MG tablet   Oral   Take 500 mg by mouth every 4 (four) hours as needed. For pain         . aspirin 81 MG tablet   Oral   Take 81 mg by mouth daily.           Marland Kitchen levothyroxine (SYNTHROID, LEVOTHROID) 75 MCG tablet   Oral   Take 75 mcg by mouth daily.           . memantine (NAMENDA) 10 MG tablet   Oral   Take 10 mg by mouth 2 (two) times daily.          Marland Kitchen oxyCODONE-acetaminophen  (PERCOCET/ROXICET) 5-325 MG per tablet   Oral   Take 1 tablet by mouth every 4 (four) hours as needed for pain.   20 tablet   0   . pravastatin (PRAVACHOL) 40 MG tablet   Oral   Take 40 mg by mouth at bedtime.          . Thiamine HCl (VITAMIN B-1) 100 MG tablet   Oral   Take 100 mg by mouth daily.             BP 154/73  Pulse 80  Temp(Src) 97.8 F (36.6 C) (Rectal)  Resp 18  SpO2 97%  Physical Exam  Nursing note and vitals reviewed. Constitutional: She appears well-developed and well-nourished. No distress.  HENT:  Head: Normocephalic and atraumatic.  Eyes: EOM are normal.  Neck: Neck supple.  Immobilized in cervical collar.  Cervical and paracervical tenderness without cervical step-offs.  Cardiovascular: Normal rate, regular rhythm and normal heart sounds.   Pulmonary/Chest: Effort normal  and breath sounds normal.  Abdominal: Soft. She exhibits no distension. There is no tenderness.  Musculoskeletal: Normal range of motion.  Full range of motion bilateral hips  Neurological: She is alert.  Equal strength in arms and legs  Skin: Skin is warm and dry.  Psychiatric: She has a normal mood and affect. Judgment normal.    ED Course  Procedures (including critical care time)  Labs Reviewed - No data to display Ct Head Wo Contrast  09/09/2012  *RADIOLOGY REPORT*  Clinical Data:  79 year old female status post fall with altered level of consciousness.  CT HEAD WITHOUT CONTRAST CT CERVICAL SPINE WITHOUT CONTRAST  Technique:  Multidetector CT imaging of the head and cervical spine was performed following the standard protocol without intravenous contrast.  Multiplanar CT image reconstructions of the cervical spine were also generated.  Comparison:  09/01/2012 and earlier.  CT HEAD  Findings: To the no scalp hematoma identified.  Stable orbit soft tissues. Visualized paranasal sinuses and mastoids are clear. Osteopenia.  Calvarium appears stable and intact.  Calcified  atherosclerosis at the skull base.  Stable cerebral volume.  No ventriculomegaly. No midline shift, mass effect, or evidence of mass lesion.  Stable gray-white matter differentiation throughout the brain.  No evidence of cortically based acute infarction identified.  No suspicious intracranial vascular hyperdensity. No acute intracranial hemorrhage identified.  IMPRESSION: 1.  Stable. No acute intracranial abnormality. 2.  Cervical spine findings are below.  CT CERVICAL SPINE  Findings: Stable cervical vertebral height and alignment. Visualized skull base is intact.  No atlanto-occipital dissociation.  Cervicothoracic junction alignment is within normal limits.  Bilateral posterior element alignment is within normal limits.  No acute cervical fracture identified.  Stable paraspinal soft tissues.  Stable and negative lung apices.  Grossly intact visible upper thoracic levels.  Moderate broad-based disc herniation at C4-C5 is chronic, visible since 2012.  IMPRESSION: No acute fracture or listhesis identified in the cervical spine. Ligamentous injury is not excluded.   Original Report Authenticated By: Erskine Speed, M.D.    Ct Cervical Spine Wo Contrast  09/09/2012  *RADIOLOGY REPORT*  Clinical Data:  79 year old female status post fall with altered level of consciousness.  CT HEAD WITHOUT CONTRAST CT CERVICAL SPINE WITHOUT CONTRAST  Technique:  Multidetector CT imaging of the head and cervical spine was performed following the standard protocol without intravenous contrast.  Multiplanar CT image reconstructions of the cervical spine were also generated.  Comparison:  09/01/2012 and earlier.  CT HEAD  Findings: To the no scalp hematoma identified.  Stable orbit soft tissues. Visualized paranasal sinuses and mastoids are clear. Osteopenia.  Calvarium appears stable and intact.  Calcified atherosclerosis at the skull base.  Stable cerebral volume.  No ventriculomegaly. No midline shift, mass effect, or evidence of mass  lesion.  Stable gray-white matter differentiation throughout the brain.  No evidence of cortically based acute infarction identified.  No suspicious intracranial vascular hyperdensity. No acute intracranial hemorrhage identified.  IMPRESSION: 1.  Stable. No acute intracranial abnormality. 2.  Cervical spine findings are below.  CT CERVICAL SPINE  Findings: Stable cervical vertebral height and alignment. Visualized skull base is intact.  No atlanto-occipital dissociation.  Cervicothoracic junction alignment is within normal limits.  Bilateral posterior element alignment is within normal limits.  No acute cervical fracture identified.  Stable paraspinal soft tissues.  Stable and negative lung apices.  Grossly intact visible upper thoracic levels.  Moderate broad-based disc herniation at C4-C5 is chronic, visible since 2012.  IMPRESSION:  No acute fracture or listhesis identified in the cervical spine. Ligamentous injury is not excluded.   Original Report Authenticated By: Erskine Speed, M.D.    I personally reviewed the imaging tests through PACS system I reviewed available ER/hospitalization records through the EMR   1. Fall, initial encounter   2. Head injury, initial encounter   3. Neck pain       MDM  CT head C-spine normal.  Chest and abdomen benign.  Full range of motion bilateral hips.  Discharge back to the nursing home.        Lyanne Co, MD 09/09/12 [phone]

## 2012-09-09 NOTE — ED Notes (Signed)
Pt from [facility] unit of [facility]. Pt arrived alert. Oriented to little. Larey Seat out of bed. Pt taken off back board by Dr. Patria Mane. No obvious deformities noted. Bruising noted to L hand and middle finger. Pt does say ouch with movement.

## 2012-09-14 ENCOUNTER — Other Ambulatory Visit (HOSPITAL_COMMUNITY): Payer: Self-pay | Admitting: Family Medicine

## 2012-09-14 ENCOUNTER — Ambulatory Visit (HOSPITAL_COMMUNITY)
Admission: RE | Admit: 2012-09-14 | Discharge: 2012-09-14 | Disposition: A | Discharge status: Home / Self Care | Payer: Medicare Other | Source: Ambulatory Visit | Attending: Family Medicine | Admitting: Family Medicine

## 2012-09-14 DIAGNOSIS — K5900 Constipation, unspecified: Secondary | ICD-10-CM

## 2012-09-19 ENCOUNTER — Encounter: Payer: Self-pay | Admitting: Orthopedic Surgery

## 2012-09-19 ENCOUNTER — Ambulatory Visit (INDEPENDENT_AMBULATORY_CARE_PROVIDER_SITE_OTHER): Payer: Medicare Other | Admitting: Orthopedic Surgery

## 2012-09-19 VITALS — BP 112/62 | Ht 65.0 in | Wt 135.0 lb

## 2012-09-19 DIAGNOSIS — S22000A Wedge compression fracture of unspecified thoracic vertebra, initial encounter for closed fracture: Secondary | ICD-10-CM

## 2012-09-19 DIAGNOSIS — S32000A Wedge compression fracture of unspecified lumbar vertebra, initial encounter for closed fracture: Secondary | ICD-10-CM

## 2012-09-19 DIAGNOSIS — S22009A Unspecified fracture of unspecified thoracic vertebra, initial encounter for closed fracture: Secondary | ICD-10-CM

## 2012-09-19 DIAGNOSIS — S32009A Unspecified fracture of unspecified lumbar vertebra, initial encounter for closed fracture: Secondary | ICD-10-CM

## 2012-09-19 NOTE — Progress Notes (Signed)
Patient ID: [name] [name], female   DOB: 18-May-1926, 79 y.o.   MRN: [id number] Chief Complaint  Patient presents with  . Back Injury    Compresssion fracture at T10 and L2 and L4. She fell 1st part of April.    Patient history this patient presents with questionable fractures in the lumbar and thoracic spine after a fall. A CT scan was done which showed possible T10 and L2 fractures of questionable acuity. The patient fell started to deteriorate where she couldn't hold things couldn't get out of a chair couldn't walk wasn't eating became constipated she now presents for evaluation of these injuries. The history is very difficult to obtain from the patient as she has dementia the caregiver/family members here with her gives some insight as stated  Unexpected weight loss and fatigue is noted as well constipation an unsteady gait review of systems otherwise normal  Patient has a history of penicillin allergy and sulfur allergy  History of dementia hypothyroidism vascular blockage in the cervical region history of lumpectomy  Family history heart disease arthritis cancer, diabetes, asthma  Social history retired from [employer] does not smoke or drink currently lives in [facility]  Note patient's caregiver notices that the patient has been able to get up today and check she got a chair on her own today and was able to walk on her own  General appearance is normal, the patient is alert and oriented x3 with normal mood and affect. Ambulation as stated patient ambulates without assistive device although her gait is slow and her stride length is diminished she walks with flexion contracture and a shuffling gait  She has tenderness in the right rib cage and also in the thoracic lumbar junction.   Upper extremity exam  The right and left upper extremity:   Inspection and palpation revealed no abnormalities in the upper extremities.   Range of motion is full without  contracture.  Motor exam is normal with grade 5 strength.  The joints are fully reduced without subluxation.  There is no atrophy or tremor and muscle tone is normal.  All joints are stable.   Lower extremity exam  The right and left lower extremity:  Inspection and palpation revealed no tenderness or abnormality in alignment in the lower extremities. Range of motion is with mild contracture but full.  Strength is grade 5.  All joints are stable.  CT scan reviewed along with thoracic and lumbar x-rays  No acuity can be determined from these x-rays  I discussed this with the patient's daughter and she will call me back tomorrow to discuss whether or not she wants the patient evaluated for vertebral plasty or kyphoplasty

## 2012-09-20 ENCOUNTER — Telehealth: Payer: Self-pay | Admitting: Orthopedic Surgery

## 2012-09-20 NOTE — Telephone Encounter (Signed)
ok 

## 2012-09-20 NOTE — Telephone Encounter (Signed)
[name] [name]'s daughter, Otho Najjar called to let you know she does not want to continue with the Neuro referral you mentioned at yesterday's appointment. She does not think [facility] can go thru with the MRI or anything else at this time. Donna's # [phone]

## 2012-09-21 NOTE — Telephone Encounter (Signed)
Left a message for [name] [name] to call our office

## 2012-09-22 NOTE — Telephone Encounter (Signed)
Lupita Leash called back, relayed that Dr. Romeo Apple reviewed

## 2012-10-03 ENCOUNTER — Encounter (HOSPITAL_COMMUNITY): Payer: Self-pay

## 2012-10-03 ENCOUNTER — Emergency Department (HOSPITAL_COMMUNITY): Payer: Medicare Other

## 2012-10-03 ENCOUNTER — Inpatient Hospital Stay (HOSPITAL_COMMUNITY)
Admission: EM | Admit: 2012-10-03 | Discharge: 2012-10-08 | DRG: 378 | Disposition: A | Discharge status: Nursing Home (Includes ICF) | Payer: Medicare Other | Attending: Family Medicine | Admitting: Family Medicine

## 2012-10-03 ENCOUNTER — Inpatient Hospital Stay (HOSPITAL_COMMUNITY): Payer: Medicare Other

## 2012-10-03 DIAGNOSIS — D649 Anemia, unspecified: Secondary | ICD-10-CM

## 2012-10-03 DIAGNOSIS — M810 Age-related osteoporosis without current pathological fracture: Secondary | ICD-10-CM | POA: Diagnosis present

## 2012-10-03 DIAGNOSIS — Z87891 Personal history of nicotine dependence: Secondary | ICD-10-CM

## 2012-10-03 DIAGNOSIS — D62 Acute posthemorrhagic anemia: Secondary | ICD-10-CM | POA: Diagnosis present

## 2012-10-03 DIAGNOSIS — Z7982 Long term (current) use of aspirin: Secondary | ICD-10-CM

## 2012-10-03 DIAGNOSIS — E440 Moderate protein-calorie malnutrition: Secondary | ICD-10-CM | POA: Diagnosis present

## 2012-10-03 DIAGNOSIS — Z882 Allergy status to sulfonamides status: Secondary | ICD-10-CM

## 2012-10-03 DIAGNOSIS — Z79899 Other long term (current) drug therapy: Secondary | ICD-10-CM

## 2012-10-03 DIAGNOSIS — E871 Hypo-osmolality and hyponatremia: Secondary | ICD-10-CM | POA: Diagnosis present

## 2012-10-03 DIAGNOSIS — K5900 Constipation, unspecified: Secondary | ICD-10-CM | POA: Diagnosis present

## 2012-10-03 DIAGNOSIS — R531 Weakness: Secondary | ICD-10-CM

## 2012-10-03 DIAGNOSIS — I2510 Atherosclerotic heart disease of native coronary artery without angina pectoris: Secondary | ICD-10-CM | POA: Diagnosis present

## 2012-10-03 DIAGNOSIS — N390 Urinary tract infection, site not specified: Secondary | ICD-10-CM

## 2012-10-03 DIAGNOSIS — R4182 Altered mental status, unspecified: Secondary | ICD-10-CM

## 2012-10-03 DIAGNOSIS — IMO0002 Reserved for concepts with insufficient information to code with codable children: Secondary | ICD-10-CM

## 2012-10-03 DIAGNOSIS — I959 Hypotension, unspecified: Secondary | ICD-10-CM | POA: Diagnosis present

## 2012-10-03 DIAGNOSIS — H8109 Meniere's disease, unspecified ear: Secondary | ICD-10-CM | POA: Diagnosis present

## 2012-10-03 DIAGNOSIS — K254 Chronic or unspecified gastric ulcer with hemorrhage: Principal | ICD-10-CM | POA: Diagnosis present

## 2012-10-03 DIAGNOSIS — K922 Gastrointestinal hemorrhage, unspecified: Secondary | ICD-10-CM

## 2012-10-03 DIAGNOSIS — Z886 Allergy status to analgesic agent status: Secondary | ICD-10-CM

## 2012-10-03 DIAGNOSIS — E785 Hyperlipidemia, unspecified: Secondary | ICD-10-CM | POA: Diagnosis present

## 2012-10-03 DIAGNOSIS — F0390 Unspecified dementia without behavioral disturbance: Secondary | ICD-10-CM

## 2012-10-03 DIAGNOSIS — Z66 Do not resuscitate: Secondary | ICD-10-CM | POA: Diagnosis present

## 2012-10-03 DIAGNOSIS — M8448XA Pathological fracture, other site, initial encounter for fracture: Secondary | ICD-10-CM | POA: Diagnosis present

## 2012-10-03 DIAGNOSIS — Z880 Allergy status to penicillin: Secondary | ICD-10-CM

## 2012-10-03 DIAGNOSIS — R109 Unspecified abdominal pain: Secondary | ICD-10-CM

## 2012-10-03 DIAGNOSIS — E039 Hypothyroidism, unspecified: Secondary | ICD-10-CM | POA: Diagnosis present

## 2012-10-03 DIAGNOSIS — Z9181 History of falling: Secondary | ICD-10-CM

## 2012-10-03 HISTORY — DX: Nausea with vomiting, unspecified: Z98.890

## 2012-10-03 HISTORY — DX: Chronic obstructive pulmonary disease, unspecified: J44.9

## 2012-10-03 HISTORY — DX: Anemia, unspecified: D64.9

## 2012-10-03 HISTORY — DX: Nausea with vomiting, unspecified: R11.2

## 2012-10-03 LAB — URINALYSIS, ROUTINE W REFLEX MICROSCOPIC
Hgb urine dipstick: NEGATIVE
Nitrite: NEGATIVE
Protein, ur: NEGATIVE mg/dL
Specific Gravity, Urine: 1.01 (ref 1.005–1.030)
Urobilinogen, UA: 1 mg/dL (ref 0.0–1.0)

## 2012-10-03 LAB — CBC
HCT: 17.4 % — ABNORMAL LOW (ref 36.0–46.0)
HCT: 25.7 % — ABNORMAL LOW (ref 36.0–46.0)
Hemoglobin: 5.3 g/dL — CL (ref 12.0–15.0)
Hemoglobin: 8.6 g/dL — ABNORMAL LOW (ref 12.0–15.0)
MCH: 24.1 pg — ABNORMAL LOW (ref 26.0–34.0)
MCH: 26.5 pg (ref 26.0–34.0)
MCHC: 30.5 g/dL (ref 30.0–36.0)
MCV: 79.1 fL (ref 78.0–100.0)
MCV: 79.1 fL (ref 78.0–100.0)
Platelets: 412 10*3/uL — ABNORMAL HIGH (ref 150–400)
RBC: 2.2 MIL/uL — ABNORMAL LOW (ref 3.87–5.11)
RBC: 3.25 MIL/uL — ABNORMAL LOW (ref 3.87–5.11)
RDW: 16 % — ABNORMAL HIGH (ref 11.5–15.5)
WBC: 11.6 10*3/uL — ABNORMAL HIGH (ref 4.0–10.5)
WBC: 12.3 10*3/uL — ABNORMAL HIGH (ref 4.0–10.5)

## 2012-10-03 LAB — PREPARE RBC (CROSSMATCH)

## 2012-10-03 LAB — BASIC METABOLIC PANEL
CO2: 27 mEq/L (ref 19–32)
Calcium: 8.8 mg/dL (ref 8.4–10.5)
Chloride: 102 mEq/L (ref 96–112)
GFR calc Af Amer: 87 mL/min — ABNORMAL LOW (ref >90)
Sodium: 134 mEq/L — ABNORMAL LOW (ref 135–145)

## 2012-10-03 LAB — LIPID PANEL
HDL: 54 mg/dL (ref >39)
LDL Cholesterol: 46 mg/dL (ref 0–99)
Total CHOL/HDL Ratio: 2.1 RATIO
Triglycerides: 64 mg/dL (ref <150)

## 2012-10-03 LAB — TROPONIN I: Troponin I: <0.3 ng/mL (ref <0.30)

## 2012-10-03 LAB — OCCULT BLOOD, POC DEVICE: Fecal Occult Bld: POSITIVE — AB

## 2012-10-03 LAB — ABO/RH: ABO/RH(D): A POS

## 2012-10-03 LAB — MRSA PCR SCREENING: MRSA by PCR: NEGATIVE

## 2012-10-03 MED ORDER — PANTOPRAZOLE SODIUM 40 MG IV SOLR
40.0000 mg | Freq: Two times a day (BID) | INTRAVENOUS | Status: DC
  Administered 2012-10-04 – 2012-10-05 (×3): 40 mg via INTRAVENOUS
  Filled 2012-10-03 (×3): qty 40

## 2012-10-03 MED ORDER — ALUM & MAG HYDROXIDE-SIMETH 200-200-20 MG/5ML PO SUSP: 30.0000 mL | Freq: Four times a day (QID) | ORAL | Status: DC | PRN

## 2012-10-03 MED ORDER — CIPROFLOXACIN IN D5W 400 MG/200ML IV SOLN
400.0000 mg | Freq: Once | INTRAVENOUS | Status: DC
  Filled 2012-10-03: qty 200

## 2012-10-03 MED ORDER — SODIUM CHLORIDE 0.9 % IJ SOLN
3.0000 mL | Freq: Two times a day (BID) | INTRAMUSCULAR | Status: DC
  Administered 2012-10-03 – 2012-10-07 (×6): 3 mL via INTRAVENOUS

## 2012-10-03 MED ORDER — PANTOPRAZOLE SODIUM 40 MG IV SOLR
40.0000 mg | Freq: Two times a day (BID) | INTRAVENOUS | Status: DC
  Filled 2012-10-03: qty 40

## 2012-10-03 MED ORDER — ONDANSETRON HCL 4 MG/2ML IJ SOLN: 4.0000 mg | Freq: Four times a day (QID) | INTRAMUSCULAR | Status: DC | PRN

## 2012-10-03 MED ORDER — HYDROMORPHONE HCL PF 1 MG/ML IJ SOLN: 0.5000 mg | INTRAMUSCULAR | Status: DC | PRN

## 2012-10-03 MED ORDER — SODIUM CHLORIDE 0.9 % IV SOLN
INTRAVENOUS | Status: DC
  Administered 2012-10-03 – 2012-10-07 (×7): via INTRAVENOUS

## 2012-10-03 MED ORDER — SODIUM CHLORIDE 0.9 % IV SOLN
INTRAVENOUS | Status: DC
  Administered 2012-10-03: 15:00:00 via INTRAVENOUS

## 2012-10-03 MED ORDER — SODIUM CHLORIDE 0.9 % IV BOLUS (SEPSIS): 500.0000 mL | Freq: Once | INTRAVENOUS | Status: AC

## 2012-10-03 MED ORDER — BIOTENE DRY MOUTH MT LIQD
15.0000 mL | Freq: Two times a day (BID) | OROMUCOSAL | Status: DC
  Administered 2012-10-03 – 2012-10-08 (×10): 15 mL via OROMUCOSAL

## 2012-10-03 MED ORDER — ACETAMINOPHEN 650 MG RE SUPP: 650.0000 mg | Freq: Four times a day (QID) | RECTAL | Status: DC | PRN

## 2012-10-03 MED ORDER — MORPHINE SULFATE 2 MG/ML IJ SOLN
1.0000 mg | INTRAMUSCULAR | Status: DC | PRN
  Administered 2012-10-03: 1 mg via INTRAVENOUS
  Filled 2012-10-03: qty 1

## 2012-10-03 MED ORDER — SODIUM CHLORIDE 0.9 % IV BOLUS (SEPSIS)
500.0000 mL | Freq: Once | INTRAVENOUS | Status: AC
  Administered 2012-10-03: 500 mL via INTRAVENOUS

## 2012-10-03 MED ORDER — IOHEXOL 300 MG/ML  SOLN
100.0000 mL | Freq: Once | INTRAMUSCULAR | Status: AC | PRN
  Administered 2012-10-03: 100 mL via INTRAVENOUS

## 2012-10-03 MED ORDER — CIPROFLOXACIN IN D5W 400 MG/200ML IV SOLN: 400.0000 mg | Freq: Once | INTRAVENOUS | Status: DC

## 2012-10-03 MED ORDER — ACETAMINOPHEN 325 MG PO TABS: 650.0000 mg | ORAL_TABLET | Freq: Four times a day (QID) | ORAL | Status: DC | PRN

## 2012-10-03 MED ORDER — IOHEXOL 300 MG/ML  SOLN: 50.0000 mL | Freq: Once | INTRAMUSCULAR | Status: AC | PRN

## 2012-10-03 MED ORDER — CIPROFLOXACIN IN D5W 400 MG/200ML IV SOLN
400.0000 mg | Freq: Two times a day (BID) | INTRAVENOUS | Status: DC
  Administered 2012-10-03 – 2012-10-05 (×4): 400 mg via INTRAVENOUS
  Filled 2012-10-03 (×6): qty 200

## 2012-10-03 MED ORDER — ONDANSETRON HCL 4 MG PO TABS: 4.0000 mg | ORAL_TABLET | Freq: Four times a day (QID) | ORAL | Status: DC | PRN

## 2012-10-03 NOTE — ED Notes (Signed)
CRITICAL VALUE ALERT  Critical value received: hgb 5.3  Date of notification:  10/03/12  Time of notification:  0929  Critical value read back:Nurse who received alert:  Yes j. Bing Quarry  MD notified (1st page):  I . knapp  Time of first page: 0940  MD notified (2nd page):  Time of second page:  Responding MD:  Lars Mage  Time MD responded:  [phone]

## 2012-10-03 NOTE — ED Notes (Signed)
While administering blood product, nurse checked IV site after the first 15 mins and noticed the IV site was leaking blood.  Nurse attempted to tighten the tubing but was unsuccessful.  Old IV removed, new IV started.  Blood was then restarted.

## 2012-10-03 NOTE — ED Notes (Signed)
Pt here from [facility] for evaluation of abnormal lab. Hemoglobin of 5.6.

## 2012-10-03 NOTE — Consult Note (Signed)
Referring Provider: No ref. provider found Primary Care Physician:  [name], MD Primary Gastroenterologist:  Jonette Eva  Reason for Consultation:  Anemia, abdominal pain  HPI:  Ms. [name] [name] is an 79 year old female with a history of dementia, presenting to the ED due to significant anemia with Hgb 5.6. April 2012, Hgb normal. Apparently, blood work done by PCP as an outpatient. Heme positive on admission. Possible history of melena recently. Last colonoscopy in 2008 with tubular adenomas, pancolonic diverticulosis. 2 units of blood have been ordered, 1 has been completed.    PT UNABLE TO GIVE HISTORY DUE TO SEVERE DEMENTIA. DAUGHTER DENIES BRBPR, MELENA, OR HEMATEMESIS. PT HAS HAD POOR PO INTAKE SINCE APR 2014 WHEN SHE FELL. ALSO APR 2014 C/O LOWER ABD PAIN. PLAIN FILMS SHOWED MODERATE STOOL BURDEN.  Past Medical History  Diagnosis Date  . Dementia   . Falls frequently   . CAD (coronary artery disease)   . Hypothyroidism   . Meniere disease   . Hyperlipidemia   . Osteoporosis   . Back pain     Past Surgical History  Procedure Laterality Date  . Colonoscopy  Oct 2008    Dr. Darrick Penna: pancolonic diverticulosis, tubular adenomas   Prior to Admission medications   Medication Sig Start Date End Date Taking? Authorizing Provider  amLODipine (NORVASC) 5 MG tablet Take 5 mg by mouth daily.   Yes Historical Provider, MD  aspirin EC 81 MG tablet Take 81 mg by mouth daily.   Yes Historical Provider, MD  lactulose (CHRONULAC) 10 GM/15ML solution Take 10 g by mouth 3 (three) times daily.   Yes Historical Provider, MD  levothyroxine (SYNTHROID, LEVOTHROID) 75 MCG tablet Take 75 mcg by mouth daily.     Yes Historical Provider, MD  magnesium hydroxide (MILK OF MAGNESIA) 400 MG/5ML suspension Take 30 mLs by mouth daily as needed for constipation.   Yes Historical Provider, MD  memantine (NAMENDA) 10 MG tablet Take 10 mg by mouth 2 (two) times daily.    Yes Historical Provider, MD   pravastatin (PRAVACHOL) 40 MG tablet Take 40 mg by mouth at bedtime.    Yes Historical Provider, MD  Thiamine HCl (VITAMIN B-1) 100 MG tablet Take 100 mg by mouth daily.     Yes Historical Provider, MD  acetaminophen (TYLENOL) 500 MG tablet Take 500 mg by mouth every 4 (four) hours as needed. For pain    Historical Provider, MD  oxyCODONE-acetaminophen (PERCOCET/ROXICET) 5-325 MG per tablet Take 1 tablet by mouth every 4 (four) hours as needed for pain. 09/01/12   Dione Booze, MD    Current Facility-Administered Medications  Medication Dose Route Frequency Provider Last Rate Last Dose  . 0.9 %  sodium chloride infusion   Intravenous Continuous Christiane Ha, MD      . acetaminophen (TYLENOL) tablet 650 mg  650 mg Oral Q6H PRN Gwenyth Bender, NP       Or  . acetaminophen (TYLENOL) suppository 650 mg  650 mg Rectal Q6H PRN Gwenyth Bender, NP      . alum & mag hydroxide-simeth (MAALOX/MYLANTA) 200-200-20 MG/5ML suspension 30 mL  30 mL Oral Q6H PRN Gwenyth Bender, NP      . antiseptic oral rinse (BIOTENE) solution 15 mL  15 mL Mouth Rinse BID [name], MD      . ciprofloxacin (CIPRO) IVPB 400 mg  400 mg Intravenous Q12H Christiane Ha, MD      . morphine 2 MG/ML injection 1  mg  1 mg Intravenous Q3H PRN Christiane Ha, MD      . ondansetron [facility]) tablet 4 mg  4 mg Oral Q6H PRN Gwenyth Bender, NP       Or  . ondansetron [facility]) injection 4 mg  4 mg Intravenous Q6H PRN Gwenyth Bender, NP      . Melene Muller ON 10/04/2012] pantoprazole (PROTONIX) injection 40 mg  40 mg Intravenous BID AC Luvenia Cranford L Terriona Horlacher, MD      . sodium chloride 0.9 % injection 3 mL  3 mL Intravenous Q12H Gwenyth Bender, NP   3 mL at 10/03/12 1525    Allergies as of 10/03/2012 - Review Complete 10/03/2012  Allergen Reaction Noted  . Codeine    . Penicillins    . Sulfonamide derivatives      Family History:  Colon Cancer  negative                           Polyps  negative   History   Social History  .  Marital Status: Widowed    Spouse Name: N/A    Number of Children: N/A  . Years of Education: N/A   Social History Main Topics  . Smoking status: Former Games developer  . Smokeless tobacco: Never Used  . Alcohol Use: No  . Drug Use: No  . Sexually Active: No   Review of Systems: PER HPI OTHERWISE ALL SYSTEMS ARE NEGATIVE. LIMITED DUE TO PT'S COGNITIVE DYSFUNCTION.  Vitals: Blood pressure 128/56, pulse 73, temperature 97.1 F (36.2 C), temperature source Axillary, resp. rate 15, height 5\' 5"  (1.651 m), weight 125 lb 10.6 oz (57 kg), SpO2 99.00%.  Physical Exam: General:   AROUSABLE in NAD Head:  Normocephalic and atraumatic. Eyes:  Sclera clear, no icterus.    Mouth:  No deformity or lesions, dentition ABnormal. Neck:  Supple;  Lungs:  Clear throughout to auscultation.   No wheezes. No acute distress. Heart:  Regular rate and rhythm; no murmurs. Abdomen:  Soft, nontender and nondistended. No masses noted. Normal bowel sounds, without guarding, and without rebound.   Msk:  Symmetrical  Extremities:  Without edema. Neurologic:  NO NEW FOCAL DEFICITS Cervical Nodes:  No significant cervical adenopathy. Psych:  FLAT AFFECT  Lab Results:  Recent Labs  10/03/12 0904  WBC 11.6*  HGB 5.3*  HCT 17.4*  PLT 412*   BMET  Recent Labs  10/03/12 0904  NA 134*  K 4.1  CL 102  CO2 27  GLUCOSE 186*  BUN 14  CREATININE 0.76  CALCIUM 8.8   LFT No results found for this basename: PROT, ALBUMIN, AST, ALT, ALKPHOS, BILITOT, BILIDIR, IBILI,  in the last 72 hours   Studies/Results: PER HPI  Impression: PROFOUND ANEMIA W/O EVIDENCE OF ACTIVE GI BLEED IN PT WITH DEMENTIA. ALSO C/O ABD PAIN APR 2014 NOW WITH MENTAL STATUS CHANGES.  Plan: 1. CT A/P W/ IV CONTRAST. CONTINUE HYDRATION. DISCUSSED RISK V. BENEFITS WITH DAUGHTER. 2. DAILY PPI 3. EGD MAY 7. DISCUSSED RISK V. BENEFITS WITH DAUGHTER.    LOS: 0 days   Jacory Kamel  10/03/2012, 4:11 PM

## 2012-10-03 NOTE — H&P (Signed)
[facility] History and Physical  Ajayla Iglesias JXB:[id number] DOB: 1926/05/05 DOA: 10/03/2012  Referring physician:  PCP: [name], MD  Specialists:   Chief Complaint: Hg 5.6  HPI: [name] [name] is a 79 y.o. female with past medical history that includes CAD, dementia, hypothyroidism, hyperlipidemia, diverticulosis, frequent falls of late who presents to ED per PCP for Hg 5.6. Information obtained from daughter who is at bedside. She reports pt fell about 3 weeks ago without injury but has "not been same since". Stated that at baseline, pt is ambulatory, somewhat verbal, can make needs known, active. Since the fall 3 weeks ago she has noted steady decline in energy, appetite, interaction. States pt will complain of abdominal pain occasionally and will be treated for constipation at the nursing home. Reports of dark stool for last several days. No recent reported fever, chills, nausea, vomiting, diarrhea. Pt does suffer chronic back pain and is given tylenol and percocet for that. Yesterday daughter took pt to PCP as she thought she looked pale and has not been eating. Hg result 5.6 and facility sent to ED. Symptoms came on gradually, have persisted. Characterized as moderate to severe. Nothing makes better or worse. Work up in ED yields FOBT+, Hg 5.3, UTI. [facility] asked to admit.   Review of Systems: unable to assess due to dementia. Information in HPI obtained from daughter and records.   Past Medical History  Diagnosis Date  . Dementia   . Falls frequently   . CAD (coronary artery disease)   . Hypothyroidism   . Meniere disease   . Hyperlipidemia   . Osteoporosis   . Back pain    History reviewed. No pertinent past surgical history. Social History:  reports that she does not drink alcohol. Her tobacco and drug histories are not on file. Lives in nursing facility is ambulatory, can make needs known, recognizes family, assistance with feeding.   Allergies  Allergen  Reactions  . Codeine   . Penicillins   . Sulfonamide Derivatives     Family history:  Unable due to patient factors.   Prior to Admission medications   Medication Sig Start Date End Date Taking? Authorizing Provider  amLODipine (NORVASC) 5 MG tablet Take 5 mg by mouth daily.   Yes Historical Provider, MD  aspirin EC 81 MG tablet Take 81 mg by mouth daily.   Yes Historical Provider, MD  lactulose (CHRONULAC) 10 GM/15ML solution Take 10 g by mouth 3 (three) times daily.   Yes Historical Provider, MD  levothyroxine (SYNTHROID, LEVOTHROID) 75 MCG tablet Take 75 mcg by mouth daily.     Yes Historical Provider, MD  magnesium hydroxide (MILK OF MAGNESIA) 400 MG/5ML suspension Take 30 mLs by mouth daily as needed for constipation.   Yes Historical Provider, MD  memantine (NAMENDA) 10 MG tablet Take 10 mg by mouth 2 (two) times daily.    Yes Historical Provider, MD  pravastatin (PRAVACHOL) 40 MG tablet Take 40 mg by mouth at bedtime.    Yes Historical Provider, MD  Thiamine HCl (VITAMIN B-1) 100 MG tablet Take 100 mg by mouth daily.     Yes Historical Provider, MD  acetaminophen (TYLENOL) 500 MG tablet Take 500 mg by mouth every 4 (four) hours as needed. For pain    Historical Provider, MD  oxyCODONE-acetaminophen (PERCOCET/ROXICET) 5-325 MG per tablet Take 1 tablet by mouth every 4 (four) hours as needed for pain. 09/01/12   Dione Booze, MD   Physical Exam: Filed Vitals:   10/03/12  1610 10/03/12 1100 10/03/12 1150 10/03/12 1215  BP:  91/43 111/46 110/62  Pulse:  72    Temp: 96.5 F (35.8 C)  97.4 F (36.3 C) 97.4 F (36.3 C)  TempSrc: Rectal  Axillary Axillary  Resp:  16    SpO2:  100%       General: somewhat pale, frail, non-toxic  Eyes: PERRL no scleral icterus  ENT: ears clear nose without drainage, mucus membrane mouth somewhat pale and dry  Neck: supple no JVD no lymphadenopathy   Cardiovascular: RRR No MGR no trace edema PPP  Respiratory: normal effort somewhat shallow BS  distant but clear no wheeze no rhonchi  Abdomen: soft +BS mild tenderness to palpation lower quadrants.   Skin: warm, somewhat pale, no rash no lesion  Musculoskeletal: no joint erythema/swelling. No clubbing no cyanosis  Psychiatric: calm attempts to follow commande  Neurologic: opens eyes to verbal stimuli, attempts to follow commands, facial symmetry, bilateral grip 4/5.   Labs on Admission:  Basic Metabolic Panel:  Recent Labs Lab 10/03/12 0904  NA 134*  K 4.1  CL 102  CO2 27  GLUCOSE 186*  BUN 14  CREATININE 0.76  CALCIUM 8.8   Liver Function Tests: No results found for this basename: AST, ALT, ALKPHOS, BILITOT, PROT, ALBUMIN,  in the last 168 hours No results found for this basename: LIPASE, AMYLASE,  in the last 168 hours No results found for this basename: AMMONIA,  in the last 168 hours CBC:  Recent Labs Lab 10/03/12 0904  WBC 11.6*  HGB 5.3*  HCT 17.4*  MCV 79.1  PLT 412*   Cardiac Enzymes:  Recent Labs Lab 10/03/12 0904  TROPONINI <0.30    BNP (last 3 results) No results found for this basename: PROBNP,  in the last 8760 hours CBG: No results found for this basename: GLUCAP,  in the last 168 hours  Radiological Exams on Admission: Dg Chest Portable 1 View  10/03/2012  *RADIOLOGY REPORT*  Clinical Data: Altered mental status  PORTABLE CHEST - 1 VIEW  Comparison: 09/01/2012  Findings: Cardiomediastinal silhouette is stable.  No acute infiltrate or pleural effusion.  No pulmonary edema.  Bony thorax is unremarkable peri  IMPRESSION: That no active disease.  No significant change.   Original Report Authenticated By: Natasha Mead, M.D.     EKG: Independently reviewed. NSR  Assessment/Plan Principal Problem: Acute blood loss anemia. Will admit. Will continue to transfuse 2 units PRBC's started in ED. Will check CBC after transfusion this evening. Have requested GI consult. Pt with hx diverticulosis and recent dark stools. No NSAID use. FOBT +.    Active Problems: GI bleed: hx diverticulosis per colonoscopy 2008 with recent dark stools. Have requested GI consult. Started protonix. EGD tomorrow. Give diet.  NPO after midnight    UTI: continue cipro. Check urine culture.    Dementia: family reports steady decline over last several week. On Namenda. Will hold for now.     CAD (coronary artery disease): no chest pain. Troponin neg. EKG NSR.     Hypothyroidism: check TSH. Resume synthroid when eating.     Hyperlipidemia: check lipids. Hold statin for now due to #2.     Dr. Darrick Penna GI spoke with her directly requesting consult  Code Status: DNR Family Communication: daughter at bedside Disposition Plan: back to facility when ready.   Time spent: 70 minutes  Gwenyth Bender [facility] Pager [phone]  If 7PM-7AM, please contact night-coverage [email or web address] Password [email or web address] 10/03/2012, 12:56 PM  Attending  note   Patient interviewed and examined. Note the above amended. Reportedly had dark stool and assisted living facility. Could be upper GI bleed. Discussed with Dr. Darrick Penna who will proceed with EGD tomorrow after anemia corrected. Dr. Janna Arch to assume care on 10/04/2012   Crista Curb, M.D.

## 2012-10-03 NOTE — ED Provider Notes (Signed)
History  This chart was scribed for Ward Givens, MD by Bennett Scrape, ED Scribe. This patient was seen in room APA14/APA14 and the patient's care was started at 9:20 AM.  CSN: [id number]  Arrival date & time 10/03/12  0845   First MD Initiated Contact with Patient 10/03/12 [phone]      Chief Complaint  Patient presents with  . Abnormal Lab    Level 5 Caveat-Pt is non-verbal currently with a h/o Dementia   The history is provided by the nursing home and a relative (son-in-law). No language interpreter was used.    HPI Comments: Derra Shartzer is a 79 y.o. female with a h/o dementia who presents to the Emergency Department from [facility] for an evaluation of an abnormal lab. Per SNF, hemoglobin was 5.6 . Son-in-law states that she was seen yesterday by Dr. Delbert Harness for decreased appetite and constipation and had blood work taken. He states that Dr. Delbert Harness wanted the pt to undergo "more testing"; however, he cannot provide more details. He reports that the pt fell twice last month and has not been quite as active as usual. He states that at baseline the pt is talkative and more alert than present. Nurse states that upon arrival to the ED the pt was awake and talking, she was able to help transfer from EMS stretcher to ED stretcher.   PCP is Dr. Delbert Harness   Past Medical History  Diagnosis Date  . Dementia   . Falls frequently   . CAD (coronary artery disease)   . Hypothyroidism   . Meniere disease   . Hyperlipidemia   . Osteoporosis   . Back pain     History reviewed. No pertinent past surgical history.  No family history on file.  History  Substance Use Topics  . Smoking status: No  . Smokeless tobacco: Not on file  . Alcohol Use: No  lives in NH  No OB history provided.  Review of Systems  Unable to perform ROS: Patient nonverbal    Allergies  Codeine; Penicillins; and Sulfonamide derivatives  Home Medications   Current Outpatient Rx  Name  Route  Sig   Dispense  Refill  . amLODipine (NORVASC) 5 MG tablet   Oral   Take 5 mg by mouth daily.         Marland Kitchen aspirin EC 81 MG tablet   Oral   Take 81 mg by mouth daily.         Marland Kitchen lactulose (CHRONULAC) 10 GM/15ML solution   Oral   Take 10 g by mouth 3 (three) times daily.         Marland Kitchen levothyroxine (SYNTHROID, LEVOTHROID) 75 MCG tablet   Oral   Take 75 mcg by mouth daily.           . magnesium hydroxide (MILK OF MAGNESIA) 400 MG/5ML suspension   Oral   Take 30 mLs by mouth daily as needed for constipation.         . memantine (NAMENDA) 10 MG tablet   Oral   Take 10 mg by mouth 2 (two) times daily.          . pravastatin (PRAVACHOL) 40 MG tablet   Oral   Take 40 mg by mouth at bedtime.          . Thiamine HCl (VITAMIN B-1) 100 MG tablet   Oral   Take 100 mg by mouth daily.           Marland Kitchen  acetaminophen (TYLENOL) 500 MG tablet   Oral   Take 500 mg by mouth every 4 (four) hours as needed. For pain         . oxyCODONE-acetaminophen (PERCOCET/ROXICET) 5-325 MG per tablet   Oral   Take 1 tablet by mouth every 4 (four) hours as needed for pain.   20 tablet   0     Triage Vitals: BP 101/77  Pulse 75  Temp(Src) 96.8 F (36 C) (Oral)  Resp 20  SpO2 100%  Vital signs normal    Physical Exam  Nursing note and vitals reviewed. Constitutional: She appears well-developed and well-nourished.  Non-toxic appearance. She does not appear ill. No distress.  Pt is somnolent   HENT:  Head: Normocephalic and atraumatic.  Right Ear: External ear normal.  Left Ear: External ear normal.  Nose: Nose normal.  Mouth/Throat: Mucous membranes are normal.  Tongue dry  Eyes: Conjunctivae and EOM are normal. Pupils are equal, round, and reactive to light.  Pupils miotic bilaterally.   Neck: Normal range of motion and full passive range of motion without pain. Neck supple.  Cardiovascular: Normal rate, regular rhythm and normal heart sounds.  Exam reveals no gallop and no friction  rub.   No murmur heard. Pulmonary/Chest: Effort normal and breath sounds normal. No respiratory distress. She has no wheezes. She has no rhonchi. She has no rales. She exhibits no tenderness and no crepitus.  Abdominal: Soft. Normal appearance and bowel sounds are normal. She exhibits no distension. There is no tenderness. There is no rebound and no guarding.  Does not appear to be painful  Genitourinary: Guaiac positive stool.  Old hemorrhoid tags, minimal stool in vault, stool normal color, hemoccult +  Musculoskeletal: Normal range of motion. She exhibits no edema and no tenderness.  Moves all extremities well.   Neurological: She has normal strength.  Pt does not respond verbally and barely opens eye to command, she does open eyes more when family enters the room  Skin: Skin is warm, dry and intact. No rash noted. No erythema. There is pallor.  Psychiatric: Her speech is normal. Her mood appears not anxious.    ED Course  Procedures (including critical care time)  Medications  sodium chloride 0.9 % bolus 500 mL (not administered)  ciprofloxacin (CIPRO) IVPB 400 mg (not administered)  sodium chloride 0.9 % bolus 500 mL (0 mLs Intravenous Stopped 10/03/12 1106)    DIAGNOSTIC STUDIES: Oxygen Saturation is 100% on 2L of O2, normal by my interpretation.    COORDINATION OF CARE: 9:23 AM-O2 stats are 93% on RA. Discussed treatment plan which includes CXR, CBC panel, BMP, stool card and troponin with pt's son-in-law at bedside and he agreed to plan.   10:26 AM-Daughter reports that she noticed that the patient has appeared pale over the past 2 weeks which is why she went to see Dr. Delbert Harness yesterday. She also reports that she was told by the SNF about one episode of dark stool that they attributed to the food that was served. She states that other residents had the same symptoms and no further episodes were noted. Last colonoscopy was several years ago but she is unaware of the diagnosis.  Discussed plan for admission and transfusion with pt's daughter and daughter agreed.   Patient had episode of hypotension which was treated with IV fluids. Patient was also prepared to be transfused packed red blood cells.  2008 colonoscopy done by Dr Darrick Penna, showed pan-diverticulosis,, no inflammatory changes, no  AVMs noted. There were polyps in her colon and rectum that were removed.  11:48 AM Dr. Lendell Caprice, hospitalist, admit to telemetry, keep patient n.p.o. for endoscopy.  Pt started on cipro for possible UTI.  She was given fluid bolus for her hypotension and it improved.   Results for orders placed during the hospital encounter of 10/03/12  CBC      Result Value Range   WBC 11.6 (*) 4.0 - 10.5 K/uL   RBC 2.20 (*) 3.87 - 5.11 MIL/uL   Hemoglobin 5.3 (*) 12.0 - 15.0 g/dL   HCT 96.0 (*) 45.4 - 09.8 %   MCV 79.1  78.0 - 100.0 fL   MCH 24.1 (*) 26.0 - 34.0 pg   MCHC 30.5  30.0 - 36.0 g/dL   RDW 11.9 (*) 14.7 - 82.9 %   Platelets 412 (*) 150 - 400 K/uL  BASIC METABOLIC PANEL      Result Value Range   Sodium 134 (*) 135 - 145 mEq/L   Potassium 4.1  3.5 - 5.1 mEq/L   Chloride 102  96 - 112 mEq/L   CO2 27  19 - 32 mEq/L   Glucose, Bld 186 (*) 70 - 99 mg/dL   BUN 14  6 - 23 mg/dL   Creatinine, Ser 5.62  0.50 - 1.10 mg/dL   Calcium 8.8  8.4 - 13.0 mg/dL   GFR calc non Af Amer 75 (*) >90 mL/min   GFR calc Af Amer 87 (*) >90 mL/min  URINALYSIS, ROUTINE W REFLEX MICROSCOPIC      Result Value Range   Color, Urine YELLOW  YELLOW   APPearance CLEAR  CLEAR   Specific Gravity, Urine 1.010  1.005 - 1.030   pH 7.0  5.0 - 8.0   Glucose, UA NEGATIVE  NEGATIVE mg/dL   Hgb urine dipstick NEGATIVE  NEGATIVE   Bilirubin Urine NEGATIVE  NEGATIVE   Ketones, ur NEGATIVE  NEGATIVE mg/dL   Protein, ur NEGATIVE  NEGATIVE mg/dL   Urobilinogen, UA 1.0  0.0 - 1.0 mg/dL   Nitrite NEGATIVE  NEGATIVE   Leukocytes, UA MODERATE (*) NEGATIVE  TROPONIN I      Result Value Range   Troponin I <0.30  <0.30  ng/mL  URINE MICROSCOPIC-ADD ON      Result Value Range   Squamous Epithelial / LPF FEW (*) RARE   WBC, UA 11-20  <3 WBC/hpf   Bacteria, UA FEW (*) RARE  OCCULT BLOOD, POC DEVICE      Result Value Range   Fecal Occult Bld POSITIVE (*) NEGATIVE  TYPE AND SCREEN      Result Value Range   ABO/RH(D) A POS     Antibody Screen NEG     Sample Expiration 10/06/2012     Unit Number [id number]     Blood Component Type RED CELLS,LR     Unit division 00     Status of Unit ISSUED     Transfusion Status OK TO TRANSFUSE     Crossmatch Result Compatible     Unit Number [id number]     Blood Component Type RED CELLS,LR     Unit division 00     Status of Unit ALLOCATED     Transfusion Status OK TO TRANSFUSE     Crossmatch Result Compatible    ABO/RH      Result Value Range   ABO/RH(D) A POS    PREPARE RBC (CROSSMATCH)      Result Value Range   Order  Confirmation ORDER PROCESSED BY BLOOD BANK     Laboratory interpretation all normal except anemia, leukocytosis, UTI, hyperglycemia  Dg Chest Portable 1 View  10/03/2012  *RADIOLOGY REPORT*  Clinical Data: Altered mental status  PORTABLE CHEST - 1 VIEW  Comparison: 09/01/2012  Findings: Cardiomediastinal silhouette is stable.  No acute infiltrate or pleural effusion.  No pulmonary edema.  Bony thorax is unremarkable peri  IMPRESSION: That no active disease.  No significant change.   Original Report Authenticated By: Natasha Mead, M.D.        Dg Abd 1 View  09/14/2012   IMPRESSION: No bowel obstruction or free intraperitoneal air.  There is moderate stool within the rectum which could represent constipation or even mild fecal impaction.  L2 compression deformity, suboptimally evaluated.  This appears new since the 09/15/2010 exam.   Original Report Authenticated By: Jeronimo Greaves, M.D.    Ct Head Wo Contrast  09/09/2012.  IMPRESSION: 1.  Stable. No acute intracranial abnormality. 2.  Cervical spine findings are below.     Ct Cervical Spine Wo  Contrast  09/09/2012  .  Marland Kitchen  IMPRESSION: No acute fracture or listhesis identified in the cervical spine. Ligamentous injury is not excluded.   Original Report Authenticated By: Erskine Speed, M.D.     Date: 10/03/2012  Rate: 70  Rhythm: normal sinus rhythm  QRS Axis: normal  Intervals: normal  ST/T Wave abnormalities: normal  Conduction Disutrbances:none  Narrative Interpretation: low voltage  Old EKG Reviewed: changes noted from 09/25/2010 now has low voltage     1. GI bleeding   2. Anemia   3. Weakness   4. Hypotension   5. UTI (lower urinary tract infection)   6. Altered mental state     Plan admission  Devoria Albe, MD, FACEP  CRITICAL CARE Performed by: Devoria Albe L Total critical care time: 32 min Critical care time was exclusive of separately billable procedures and treating other patients. Critical care was necessary to treat or prevent imminent or life-threatening deterioration. Critical care was time spent personally by me on the following activities: development of treatment plan with patient and/or surrogate as well as nursing, discussions with consultants, evaluation of patient's response to treatment, examination of patient, obtaining history from patient or surrogate, ordering and performing treatments and interventions, ordering and review of laboratory studies, ordering and review of radiographic studies, pulse oximetry and re-evaluation of patient's condition.    MDM    I personally performed the services described in this documentation, which was scribed in my presence. The recorded information has been reviewed and considered.  Devoria Albe, MD, Armando GangWard Givens, MD 10/03/12 1415

## 2012-10-03 NOTE — ED Notes (Signed)
Notified edp of bp 91/43

## 2012-10-04 ENCOUNTER — Encounter (HOSPITAL_COMMUNITY): Payer: Self-pay | Admitting: *Deleted

## 2012-10-04 ENCOUNTER — Encounter (HOSPITAL_COMMUNITY)
Admission: EM | Disposition: A | Discharge status: Nursing Home (Includes ICF) | Payer: Self-pay | Source: Home / Self Care | Attending: Family Medicine

## 2012-10-04 DIAGNOSIS — K921 Melena: Secondary | ICD-10-CM

## 2012-10-04 DIAGNOSIS — K259 Gastric ulcer, unspecified as acute or chronic, without hemorrhage or perforation: Secondary | ICD-10-CM

## 2012-10-04 DIAGNOSIS — D649 Anemia, unspecified: Secondary | ICD-10-CM

## 2012-10-04 HISTORY — PX: ESOPHAGOGASTRODUODENOSCOPY: SHX5428

## 2012-10-04 LAB — CBC
HCT: 25.9 % — ABNORMAL LOW (ref 36.0–46.0)
MCHC: 33.2 g/dL (ref 30.0–36.0)
MCV: 79 fL (ref 78.0–100.0)
Platelets: 351 10*3/uL (ref 150–400)
RDW: 15.8 % — ABNORMAL HIGH (ref 11.5–15.5)
WBC: 16.5 10*3/uL — ABNORMAL HIGH (ref 4.0–10.5)

## 2012-10-04 LAB — URINE CULTURE
Colony Count: NO GROWTH
Culture: NO GROWTH

## 2012-10-04 LAB — HEMOGLOBIN AND HEMATOCRIT, BLOOD
HCT: 26.4 % — ABNORMAL LOW (ref 36.0–46.0)
Hemoglobin: 8.1 g/dL — ABNORMAL LOW (ref 12.0–15.0)

## 2012-10-04 LAB — BASIC METABOLIC PANEL
BUN: 7 mg/dL (ref 6–23)
Calcium: 8.3 mg/dL — ABNORMAL LOW (ref 8.4–10.5)
Creatinine, Ser: 0.65 mg/dL (ref 0.50–1.10)
GFR calc Af Amer: >90 mL/min (ref >90)

## 2012-10-04 SURGERY — EGD (ESOPHAGOGASTRODUODENOSCOPY)
Anesthesia: Moderate Sedation

## 2012-10-04 MED ORDER — MEPERIDINE HCL 100 MG/ML IJ SOLN
INTRAMUSCULAR | Status: DC | PRN
  Administered 2012-10-04: 25 mg via INTRAVENOUS

## 2012-10-04 MED ORDER — SODIUM CHLORIDE 0.9 % IV SOLN: INTRAVENOUS | Status: DC

## 2012-10-04 MED ORDER — ONDANSETRON HCL 4 MG/2ML IJ SOLN
INTRAMUSCULAR | Status: AC
  Filled 2012-10-04: qty 2

## 2012-10-04 MED ORDER — STERILE WATER FOR IRRIGATION IR SOLN
Status: DC | PRN
  Administered 2012-10-04: 11:00:00

## 2012-10-04 MED ORDER — MIDAZOLAM HCL 5 MG/5ML IJ SOLN
INTRAMUSCULAR | Status: DC | PRN
  Administered 2012-10-04: 1 mg via INTRAVENOUS

## 2012-10-04 MED ORDER — BUTAMBEN-TETRACAINE-BENZOCAINE 2-2-14 % EX AERO
INHALATION_SPRAY | CUTANEOUS | Status: DC | PRN
  Administered 2012-10-04: 2 via TOPICAL

## 2012-10-04 MED ORDER — MIDAZOLAM HCL 5 MG/5ML IJ SOLN
INTRAMUSCULAR | Status: AC
  Filled 2012-10-04: qty 10

## 2012-10-04 MED ORDER — MEPERIDINE HCL 100 MG/ML IJ SOLN
INTRAMUSCULAR | Status: AC
  Filled 2012-10-04: qty 2

## 2012-10-04 NOTE — Op Note (Signed)
[facility] [address] [place], [address]   ENDOSCOPY PROCEDURE REPORT  PATIENT: [name] [name], [name] [name]  MR#: #[id number] BIRTHDATE: 02-24-1926 , 86  yrs. old GENDER: Female ENDOSCOPIST: R.  Roetta Sessions, MD FACP [facility] REFERRED BY:  Oval Linsey, M.D. PROCEDURE DATE:  10/04/2012 PROCEDURE:     EGD with gastric ulcer biopsy  INDICATIONS:      melena/hemoglobin 5.3 (8.6 after 2 unit transfusion)  INFORMED CONSENT:   The risks, benefits, limitations, alternatives and imponderables have been discussed.  The potential for biopsy, esophogeal dilation, etc. have also been reviewed.  Questions have been answered.  All parties agreeable.  Please see the history and physical in the medical record for more information.  MEDICATIONS: Versed 1 mg IV and Demerol 25 mg IV. Zofran 4 mg IV. Cetacaine spray.  DESCRIPTION OF PROCEDURE:   The EG-[id number] ([id number])  endoscope was introduced through the mouth and advanced to the second portion of the duodenum without difficulty or limitations.  The mucosal surfaces were surveyed very carefully during advancement of the scope and upon withdrawal.  Retroflexion view of the proximal stomach and esophagogastric junction was performed.      FINDINGS:  Normal esophagus. Huge gastric ulcer in the body of the stomach with some "heaped up" margins. In the center of the ulcer crater there was an opening entering into a limb of small bowel. I saw no stigmata of prior surgery. This limb was patent for several centimeters. The stomach was intact otherwise. Pylorus patent. Examination first and second portion of the duodenum revealed no abnormalities.  THERAPEUTIC / DIAGNOSTIC MANEUVERS PERFORMED:  The above-mentioned ulcer crater was biopsied multiple times with jumbo biopsy forceps for histology  COMPLICATIONS:  None  IMPRESSION:  Huge gastric ulcer involving a large part of the body of the stomach with opening to a patent small bowel  limb as described above - Likely explains presentation. I have reviewed the CT scan with Dr. Tyron Russell. Because of the size of this ulcer, it was not appreciated at the time of initial CT interpretation. Also, Dr. Tyron Russell states he can find no stigmata of prior surgery on the scan. There is a small bowel loop that is very closely approximated to the area of ulceration on the CT scan (seen in retrospect). Query small bowel fistulization with large gastric ulcer versus unknown GI surgery many years ago (daughter does not recall patient ever having any GI surgery)  RECOMMENDATIONS:  Continue twice a day PPI.  Followup on pathology. Follow hemoglbin.  Addendum: Daughter reports regular NSAID use as an outpatient.    _______________________________ R. Roetta Sessions, MD FACP [facility] eSigned:  R. Roetta Sessions, MD FACP [facility] 10/04/2012 11:49 AM     CC:  PATIENT NAME:  [name] [name], [name] [name] MR#: #[id number]

## 2012-10-04 NOTE — Clinical Social Work Psychosocial (Signed)
Clinical Social Work Department BRIEF PSYCHOSOCIAL ASSESSMENT 10/04/2012  Patient:  [name] [name],[name] [name]     Account Number:  [id number]     Admit date:  10/03/2012  Clinical Social Worker:  Nancie Neas  Date/Time:  10/04/2012 09:15 AM  Referred by:  Physician  Date Referred:  10/04/2012 Referred for  ALF Placement   Other Referral:   Interview type:  Family Other interview type:   [name] [name]- daughter    PSYCHOSOCIAL DATA Living Status:  FACILITY Admitted from facility:  [facility] Level of care:  Assisted Living Primary support name:  [name] [name] Primary support relationship to patient:  CHILD, ADULT Degree of support available:   supportive    CURRENT CONCERNS Current Concerns  Post-Acute Placement   Other Concerns:    SOCIAL WORK ASSESSMENT / PLAN CSW met with pt's daughter [name] [name] at bedside. Pt sleeping during assessment. Per [name] [name], pt has been a resident at [facility] for about two years on the memory care unit. She was diagnosed with dementia about a year before that and was living at home. Family is very involved and supportive. Per Marchelle Folks at facility, pt requires limited assist with ADLs, including feeding. Some episodes of incontinence. She had a fall about a month ago and [name] [name] reports pt has primarily used a wheelchair since that time. [name] [name] indicates that pt had been declining recently and was not as active as usual. She was found to be very anemic. Okay for return to facility at d/c.   Assessment/plan status:  Psychosocial Support/Ongoing Assessment of Needs Other assessment/ plan:   Information/referral to community resources:   [employer]    PATIENT'S/FAMILY'S RESPONSE TO PLAN OF CARE: [name] [name] reports she has been "more pleased than she could have ever imagined" with pt's placement at [facility]. She is very hopeful that pt will be able to return there when medically stable. CSW to continue to follow.       Derenda Fennel, [place] [phone]

## 2012-10-04 NOTE — Progress Notes (Signed)
[id number] 

## 2012-10-04 NOTE — Progress Notes (Signed)
INITIAL NUTRITION ASSESSMENT  DOCUMENTATION CODES Per approved criteria  -Non-severe (moderate) malnutrition in the context of acute illness or injury   INTERVENTION: Follow for diet advancement (recommend mechanical soft consistency). Re-evaluate for nutritional supplement after diet advancement.   NUTRITION DIAGNOSIS: Inadequate oral food intake related to decreased appetite, wt loss as evidenced by 7.4% wt loss x 1 month.   Goal: 1) Pt will be advanced to PO diet as medically appropriate  Monitor:  Diet advancement, PO intake, labs, wt changes, skin integrity, changes in status  Reason for Assessment: MST=2, low braden= 32  79 y.o. female  Admitting Dx: Anemia  ASSESSMENT: Pt is a resident of [address] Hx of decreased appetite and poor PO intake since fall on 09/19/12. Pt in EGD at time of visit.  Wt hx reveals 10# (7.4%) wt loss x 1 month, which is clinically singnificant. Home diet is mechanical soft.  Pt meets criteria for moderate MALNUTRITION in the context of acute illness or injury as evidenced by <75% of estimated energy intake x 1 week, 7.4% wt loss x 1 month.  Height: Ht Readings from Last 1 Encounters:  10/03/12 5\' 5"  (1.651 m)    Weight: Wt Readings from Last 1 Encounters:  10/03/12 125 lb 10.6 oz (57 kg)    Ideal Body Weight: 120#  % Ideal Body Weight: 100%  Wt Readings from Last 10 Encounters:  10/03/12 125 lb 10.6 oz (57 kg)  10/03/12 125 lb 10.6 oz (57 kg)  09/19/12 135 lb (61.236 kg)  09/01/12 135 lb (61.236 kg)  03/29/12 130 lb (58.968 kg)  11/03/11 150 lb (68.04 kg)    Usual Body Weight: 135#  % Usual Body Weight: 93%  BMI:  Body mass index is 20.91 kg/(m^2). Classified as normal weight.   Estimated Nutritional Needs: Kcal: 1000-1100 daily Protein: 57-71 grams daily Fluid: 1.0-1.1 L fluid daily  Skin: dry, redness to buttocks, ecchymosis on arms and feet  Diet Order: NPO  EDUCATION NEEDS: -Education not appropriate at this  time   Intake/Output Summary (Last 24 hours) at 10/04/12 1122 Last data filed at 10/03/12 2008  Gross per 24 hour  Intake  837.5 ml  Output      0 ml  Net  837.5 ml    Last BM: 10/01/12   Labs:   Recent Labs Lab 10/03/12 0904 10/04/12 0511  NA 134* 130*  K 4.1 3.6  CL 102 99  CO2 27 22  BUN 14 7  CREATININE 0.76 0.65  CALCIUM 8.8 8.3*  GLUCOSE 186* 148*    CBG (last 3)  No results found for this basename: GLUCAP,  in the last 72 hours  Scheduled Meds: . [facility] HOLD] antiseptic oral rinse  15 mL Mouth Rinse BID  . [facility] HOLD] ciprofloxacin  400 mg Intravenous Q12H  . meperidine      . midazolam      . ondansetron      . [MAR HOLD] pantoprazole (PROTONIX) IV  40 mg Intravenous BID AC  . [facility] HOLD] sodium chloride  3 mL Intravenous Q12H    Continuous Infusions: . sodium chloride 100 mL/hr at 10/04/12 0650  . sodium chloride      Past Medical History  Diagnosis Date  . Dementia   . Falls frequently   . CAD (coronary artery disease)   . Hypothyroidism   . Meniere disease   . Hyperlipidemia   . Osteoporosis   . Back pain   . PONV (postoperative nausea and vomiting)   .  COPD (chronic obstructive pulmonary disease)   . Anemia     Past Surgical History  Procedure Laterality Date  . Colonoscopy  Oct 2008    Dr. Darrick Penna: pancolonic diverticulosis, tubular adenomas, due for surveillance in 5 years    Melody Haver, RD, LDN Pager: [phone]

## 2012-10-04 NOTE — Progress Notes (Signed)
  Patient Details  Name: Kaiah Hosea MRN: [id number] Date of Birth: 1925-07-14 Time:  1040  Today's Date: 10/04/2012  Pt is in procedure will complete evaluation on 10/05/2012  RUSSELL,CINDY 10/04/2012, 10:41 AM

## 2012-10-05 DIAGNOSIS — B9689 Other specified bacterial agents as the cause of diseases classified elsewhere: Secondary | ICD-10-CM

## 2012-10-05 DIAGNOSIS — K279 Peptic ulcer, site unspecified, unspecified as acute or chronic, without hemorrhage or perforation: Secondary | ICD-10-CM

## 2012-10-05 DIAGNOSIS — D649 Anemia, unspecified: Secondary | ICD-10-CM

## 2012-10-05 MED ORDER — CIPROFLOXACIN IN D5W 400 MG/200ML IV SOLN
400.0000 mg | Freq: Two times a day (BID) | INTRAVENOUS | Status: DC
  Administered 2012-10-06 – 2012-10-08 (×4): 400 mg via INTRAVENOUS
  Filled 2012-10-05 (×6): qty 200

## 2012-10-05 MED ORDER — PANTOPRAZOLE SODIUM 40 MG IV SOLR
40.0000 mg | Freq: Two times a day (BID) | INTRAVENOUS | Status: DC
  Administered 2012-10-05 – 2012-10-08 (×6): 40 mg via INTRAVENOUS
  Filled 2012-10-05 (×6): qty 40

## 2012-10-05 MED ORDER — DIPHENHYDRAMINE HCL 25 MG PO CAPS
25.0000 mg | ORAL_CAPSULE | Freq: Once | ORAL | Status: AC
  Administered 2012-10-05: 25 mg via ORAL
  Filled 2012-10-05: qty 1

## 2012-10-05 MED ORDER — ACETAMINOPHEN 325 MG PO TABS
650.0000 mg | ORAL_TABLET | Freq: Once | ORAL | Status: AC
  Administered 2012-10-05: 650 mg via ORAL
  Filled 2012-10-05: qty 2

## 2012-10-05 NOTE — Clinical Documentation Improvement (Signed)
MALNUTRITION DOCUMENTATION CLARIFICATION  THIS DOCUMENT IS NOT A PERMANENT PART OF THE MEDICAL RECORD  TO RESPOND TO THE THIS QUERY, FOLLOW THE INSTRUCTIONS BELOW:  1. If needed, update documentation for the patient's encounter via the notes activity.  2. Access this query again and click edit on the In [employer].  3. After updating, or not, click F2 to complete all highlighted (required) fields concerning your review. Select "additional documentation in the medical record" OR "no additional documentation provided".  4. Click Sign note button.  5. The deficiency will fall out of your In Basket *Please let us know if you are not able to complete this workflow by phone or e-mail (listed below).  Please update your documentation within the medical record to reflect your response to this query.                                                                                        10/05/12   Dear Dr. Janna Arch / Associates,  In a better effort to capture your patient's severity of illness, reflect appropriate length of stay and utilization of resources, a review of the patient medical record has revealed the following indicators.    Based on your clinical judgment, please clarify and document in a progress note and/or discharge summary the clinical condition associated with the following supporting information:  In responding to this query please exercise your independent judgment.  The fact that a query is asked, does not imply that any particular answer is desired or expected.  Please clarify nutritional status. Thank you.  Possible Clinical Conditions?  Mild Malnutrition  Moderate Malnutrition Severe Malnutrition   Protein Calorie Malnutrition Severe Protein Calorie Malnutrition Emaciation  Cachexia   Other Condition________________ Cannot clinically determine   Supporting Information: Risk Factors: Per RD Assessment:  Pt meets criteria for moderate MALNUTRITION in the  context of acute illness or injury as evidenced by <75% of estimated energy intake x 1 week, 7.4% wt loss x 1 month. Advanced age: 52 Poor po intake Decreased appetite Weakness Mechanical soft diet Dementia  Signs & Symptoms: Ht  5'5"     Wt  125 lbs  BMI:  20.91  Treatment  Clear liquid diet IV NS @ 145ml/h I&O qshift Oral care  Nutrition Consult NUTRITION DIAGNOSIS:  Inadequate oral food intake related to decreased appetite, wt loss as evidenced by 7.4% wt loss x 1 month.  Goal:  1) Pt will be advanced to PO diet as medically appropriate  Monitor:  Diet advancement, PO intake, labs, wt changes, skin integrity, changes in status INTERVENTION:  Follow for diet advancement (recommend mechanical soft consistency). Re-evaluate for nutritional supplement after diet advancement.      You may use possible, probable, or suspect with inpatient documentation. possible, probable, suspected diagnoses MUST be documented at the time of discharge  Reviewed: Dr. Janna Arch answered Mod. Protein calorie Malnutrition on 5/10 in progress notes. Thank You,  Harless Litten RN, MSN Clinical Documentation Specialist: Office# [phone] [facility] Health Information Management [place]

## 2012-10-05 NOTE — Evaluation (Signed)
Physical Therapy Evaluation Patient Details Name: [name] [name] MRN: [id number] DOB: 02/20/1926 Today's Date: 10/05/2012 Time: 4098-1191 PT Time Calculation (min): 38 min  PT Assessment / Plan / Recommendation Clinical Impression  Pt was seen for evaluation.  She is very pleasant but has severe demential and is unable to converse intelligibly.  Her strength appears to be WNL and she was able to transfer bed to chair with min assist.  She is primarily w/c dependent since she sustained spinal compression fxs.  She appears to be close to functional baseline and should be able to transition to [facility] at d/c.  We will ask nursing service to transfer bed to chair daily.    PT Assessment  Patent does not need any further PT services    Follow Up Recommendations  No PT follow up    Does the patient have the potential to tolerate intense rehabilitation      Barriers to Discharge        Equipment Recommendations  None recommended by PT    Recommendations for Other Services     Frequency      Precautions / Restrictions Precautions Precautions: Fall Restrictions Weight Bearing Restrictions: No   Pertinent Vitals/Pain       Mobility  Bed Mobility Bed Mobility: Supine to Sit Supine to Sit: 4: Min assist;HOB flat Transfers Transfers: Sit to Stand;Stand to Sit;Stand Pivot Transfers Sit to Stand: 3: Mod assist;With upper extremity assist Stand to Sit: 4: Min assist;With upper extremity assist Stand Pivot Transfers: 4: Min assist Details for Transfer Assistance: Pt was stood x3 during tx and each time she became participatory and able to bear full weight as well as step around during pivot from chair to bed Ambulation/Gait Ambulation/Gait Assistance: Not tested (comment)    Exercises     PT Diagnosis:    PT Problem List:   PT Treatment Interventions:     PT Goals    Visit Information  Last PT Received On: 10/05/12    Subjective Data  Subjective: daughter states that Mom  lives in memory care unit at [facility] and is primarily w/c dependent since compression fxs were sustained about 6 weeks ago Patient Stated Goal: daughter wants return to [facility]   Prior Functioning  Home Living Available Help at Discharge: Personal care attendant Type of Home: Assisted living Home Access: Level entry Home Layout: One level Home Adaptive Equipment: Wheelchair - manual Prior Function Level of Independence: Needs assistance Needs Assistance: Bathing;Dressing;Feeding;Grooming;Toileting;Meal Prep;Light Housekeeping;Transfers Bath: Total Dressing: Total Feeding: Total Grooming: Total Toileting: Total Meal Prep: Total Light Housekeeping: Total Transfer Assistance: able to transfer with SBA Able to Take Stairs?: No Driving: No Vocation: Retired Musician: No difficulties    Copywriter, advertising Arousal/Alertness: Awake/alert Behavior During Therapy: WFL for tasks assessed/performed Overall Cognitive Status: History of cognitive impairments - at baseline (severe dementia)    Extremity/Trunk Assessment Right Lower Extremity Assessment RLE ROM/Strength/Tone: Within functional levels RLE Sensation: WFL - Light Touch RLE Coordination: WFL - gross motor Left Lower Extremity Assessment LLE ROM/Strength/Tone: Within functional levels LLE Sensation: WFL - Light Touch LLE Coordination: WFL - gross motor Trunk Assessment Trunk Assessment: Kyphotic   Balance Balance Balance Assessed: Yes Static Sitting Balance Static Sitting - Balance Support: No upper extremity supported;Feet supported Static Sitting - Level of Assistance: 5: Stand by assistance  End of Session PT - End of Session Equipment Utilized During Treatment: Gait belt Activity Tolerance: Patient tolerated treatment well Patient left: in chair;with call bell/phone within reach;with chair  alarm set Nurse Communication: Mobility status  GP     Konrad Penta 10/05/2012, 3:49 PM

## 2012-10-05 NOTE — Progress Notes (Signed)
[id number] 

## 2012-10-05 NOTE — Progress Notes (Signed)
NAMEJACHELLE, [name] [name] NO.:  [id number]  MEDICAL RECORD NO.:  [id number]  LOCATION:  A328                          FACILITY:  [facility]  PHYSICIAN:  Melvyn Novas, MDDATE OF BIRTH:  11-Sep-1925  DATE OF PROCEDURE: DATE OF DISCHARGE:                                PROGRESS NOTE   The patient was admitted yesterday with progressive anemia, hemoglobin of 5.9, transfused 2 units.  Hemoglobin 8.6 today.  Currently in endoscopy, undergoing EGD.  The patient appears hemodynamically stable. She has chronic dementia, osteoporosis, multiple compression fractures of thoracic and lumbosacral spine.  Temperature 97.6, pulse 64 and regular, respiratory rate is 18, blood pressure is 94/37, O2 sat is 100%.  I cannot examine the patient as she is currently in endoscopy.  We will monitor hemoglobin and hematocrit every 12 hours.  Await results of endoscopy.  She had colonoscopy 2-3 years ago, revealing diffuse diverticulosis with no evidence of carcinoma.  If EGD is unrevealing for source of bleed, we will consider colonoscopy as per GI.  Continue PPIs.     Melvyn Novas, MD     RMD/MEDQ  D:  10/04/2012  T:  10/05/2012  Job:  [id number]

## 2012-10-05 NOTE — Progress Notes (Signed)
Subjective:  Patient tolerating clear liquids, although poor intake overall. Daughter at bedside.   Objective: Vital signs in last 24 hours: Temp:  [97.8 F (36.6 C)-98 F (36.7 C)] 98 F (36.7 C) (05/08 0441) Pulse Rate:  [84-93] 84 (05/07 2023) Resp:  [13-16] 16 (05/08 0441) BP: (96-116)/(57-70) 96/57 mmHg (05/08 0441) SpO2:  [95 %-96 %] 96 % (05/07 2023) Last BM Date: 10/04/12 General:   Alert,  Elderly WF, cooperative in NAD. Daughter at bedside. Head:  Normocephalic and atraumatic. Eyes:  Sclera clear, no icterus.   Abdomen:  Soft, nontender and nondistended.  Normal bowel sounds, without guarding, and without rebound.   Extremities:  Without clubbing, deformity or edema. Neurologic:  Alert and  oriented x4;  grossly normal neurologically. Skin:  Intact without significant lesions or rashes. Psych:  Alert and cooperative. Normal mood and affect.  Intake/Output from previous day:   Intake/Output this shift:    Lab Results: CBC  Recent Labs  10/03/12 0904 10/03/12 2135 10/04/12 0511 10/04/12 1519 10/04/12 2258 10/05/12 0546  WBC 11.6* 12.3* 16.5*  --   --   --   HGB 5.3* 8.6* 8.6* 8.7* 8.1* 7.7*  HCT 17.4* 25.7* 25.9* 26.4* 24.4* 23.6*  MCV 79.1 79.1 79.0  --   --   --   PLT 412* 364 351  --   --   --    BMET  Recent Labs  10/03/12 0904 10/04/12 0511  NA 134* 130*  K 4.1 3.6  CL 102 99  CO2 27 22  GLUCOSE 186* 148*  BUN 14 7  CREATININE 0.76 0.65  CALCIUM 8.8 8.3*   Imaging Studies:   Ct Abdomen Pelvis W Contrast  10/03/2012  *RADIOLOGY REPORT*  Clinical Data: 79 year old female with weakness, GI bleed.  Anemia.  CT ABDOMEN AND PELVIS WITH CONTRAST  Technique:  Multidetector CT imaging of the abdomen and pelvis was performed following the standard protocol during bolus administration of intravenous contrast.  Contrast:  OMNIPAQUE IOHEXOL 300 MG/ML  SOLN  Comparison: Abdominal radiographs 09/14/2012.  Lumbar MRI 09/15/2010.  Findings: Trace  layering pleural effusions.  No pericardial effusion.  Mild cardiomegaly.  Streaky opacity at both lung bases most resembles atelectasis.  Osteopenia.  Severe compression fractures of T10, L2 and L4.  Near vertebral plana at each level.  Retropulsed bone resulting in some spinal stenosis at each level, maximal at L2 (moderate to severe). The lumbar fractures were present in 2012.  There is gas associated with the T10 level.  Postoperative changes to the proximal left femur.  No definite acute osseous abnormality.  Distended bladder.  Stool ball in the rectum.  Diverticula in the sigmoid colon but no associated inflammation.  Largely decompressed left colon and distal transverse colon.  Mild gaseous distention of the more proximal transverse.  Redundant hepatic flexure.  The cecum is on a lax mesentery located anteriorly in the right mid abdomen.  Terminal ileum within normal limits.  Appendix not identified.  No pericecal inflammation.  No dilated small bowel. Moderate distention of the stomach.  The duodenum is decompressed.  Decompressed gallbladder.  Liver, spleen, pancreas and portal venous system are within normal limits.  Bilateral adrenal gland thickening compatible with hyperplasia.  Atherosclerosis of the abdominal aorta and its branches.  Major arterial structures in the abdomen and pelvis remain patent.  No abdominal free fluid. Kidneys are not obstructed.  Left renal probable simple cyst.  No abdominal lymphadenopathy.  IMPRESSION: 1.  No focal inflammatory process  in the abdomen or pelvis.  Stool ball in the rectum.  Bladder is distended. 2.  Trace pleural effusions and bibasilar pulmonary atelectasis. 3. Osteopenia with severe T10, L2, and L3 compression fractures. The lumbar fractures are chronic.  The T10 level also appears chronic with evidence of osteonecrosis.  Retropulsion of bone at all three levels does result in some spinal stenosis.   Original Report Authenticated By: Erskine Speed, M.D.      Assessment: 79 y/o female with profound anemia, heme positive stools. EGD yesterday showed large gastric ulcer involving a large part of the body of the stomach with the opening to a patent small bowel limb. Query small bowel fistulization with large gastric ulcer versus unknown GI surgery many years ago (daughter does not recall patient having prior GI surgery). Patient has a history of regular NSAID (ibuprofen) use as an outpatient.  Plan: 1. Continue PPI twice a day 2. Followup on path. 3. Give additional unit of prbcs today. Discussed with daughter.   LOS: 2 days   Tana Coast  10/05/2012, 12:00 PM

## 2012-10-06 ENCOUNTER — Encounter: Payer: Self-pay | Admitting: Internal Medicine

## 2012-10-06 ENCOUNTER — Encounter (HOSPITAL_COMMUNITY): Payer: Self-pay | Admitting: Internal Medicine

## 2012-10-06 LAB — TYPE AND SCREEN
ABO/RH(D): A POS
Antibody Screen: NEGATIVE
Unit division: 0

## 2012-10-06 LAB — HEMOGLOBIN AND HEMATOCRIT, BLOOD: HCT: 33.5 % — ABNORMAL LOW (ref 36.0–46.0)

## 2012-10-06 MED ORDER — POLYSACCHARIDE IRON COMPLEX 150 MG PO CAPS
150.0000 mg | ORAL_CAPSULE | Freq: Every day | ORAL | Status: DC
  Administered 2012-10-06 – 2012-10-08 (×3): 150 mg via ORAL
  Filled 2012-10-06 (×5): qty 1

## 2012-10-06 NOTE — Progress Notes (Signed)
[id number] 

## 2012-10-06 NOTE — Progress Notes (Signed)
Subjective:  Tolerating liquids. Seems to be more active per daughter.   Objective: Vital signs in last 24 hours: Temp:  [96.6 F (35.9 C)-98.5 F (36.9 C)] 97.2 F (36.2 C) (05/09 0449) Pulse Rate:  [45-88] 45 (05/09 0449) Resp:  [16-18] 16 (05/09 0449) BP: (106-146)/(63-91) 106/91 mmHg (05/09 0449) SpO2:  [97 %-98 %] 98 % (05/09 0449) Last BM Date: 10/05/12 General:   Alert,  Elderly WF, pleasantly confused and cooperative in NAD. Daughter at bedside. Head:  Normocephalic and atraumatic. Eyes:  Sclera clear, no icterus.   Abdomen:  Soft, nontender and nondistended. Normal bowel sounds, without guarding, and without rebound.   Extremities:  Without clubbing, deformity or edema. Neurologic:  Alert and  oriented x4;  grossly normal neurologically. Skin:  Intact without significant lesions or rashes. Psych:  Alert and cooperative. Normal mood and affect.  Intake/Output from previous day:   Intake/Output this shift: Total I/O In: 120 [P.O.:120] Out: -   Lab Results: CBC  Recent Labs  10/03/12 2135 10/04/12 0511  10/04/12 2258 10/05/12 0546 10/06/12 0543  WBC 12.3* 16.5*  --   --   --   --   HGB 8.6* 8.6*  < > 8.1* 7.7* 11.1*  HCT 25.7* 25.9*  < > 24.4* 23.6* 33.5*  MCV 79.1 79.0  --   --   --   --   PLT 364 351  --   --   --   --   < > = values in this interval not displayed. BMET  Recent Labs  10/04/12 0511  NA 130*  K 3.6  CL 99  CO2 22  GLUCOSE 148*  BUN 7  CREATININE 0.65  CALCIUM 8.3*       Assessment: 79 year old female with profound anemia, Hemoccult-positive stools. EGD revealed large gastric ulcer involving a large part of the body the stomach with the opening to a patent small bowel limb.? Small bowel fistulization with large gastric ulcer versus unknown GI surgery many years ago. History of regular ibuprofen use at [facility]. Pathology came back positive for H. pylori but no malignancy.  Daughter is concerned that patient may not be able  to tolerate H. pylori treatment. It is difficult to get her to take piils at times. She has been advised to let us know if this is the issue once H. pylori treatment is started.  H/H up post 3rd unit of prbcs yesterday.  Plan: 1. Advance to full liquid diet.  2. Recommend Pylera 3 caps po QID for 10 days. PPI BID. At discharge.  3. Continue PPI BID for now.   LOS: 3 days   Tana Coast  10/06/2012, 12:28 PM

## 2012-10-06 NOTE — Progress Notes (Signed)
[id number] 

## 2012-10-06 NOTE — Progress Notes (Signed)
REVIEWED.  

## 2012-10-06 NOTE — Progress Notes (Signed)
NAME:  [name] [name], [name] [name]                    ACCOUNT NO.:  MEDICAL RECORD NO.:  [id number]  LOCATION:                                 FACILITY:  PHYSICIAN:  Melvyn Novas, MDDATE OF BIRTH:  08-01-1925  DATE OF PROCEDURE: DATE OF DISCHARGE:                                PROGRESS NOTE   The patient had status post EGD revealing ulcers as the source of bleed. Yesterday, hemoglobin stable at 7.7.  Hemoglobin and hematocrit checked every 12 hours.  Hemodynamically, stable.  Blood pressure 96/57, temperature 98, respiratory rate 16.  Lungs are clear.  No rales, wheeze, or rhonchi. Heart regular rhythm.  No murmurs, gallops, heaves, thrills, or rubs. Abdomen is soft, nontender.  Bowel sounds are normoactive.  No guarding or rebound.  PLAN:  Right now is to continue monitoring hemoglobin and hematocrit if it further drops.  Continue Protonix IV 40 q.12.  Advance diet from liquid diet as per GI and make further recommendations as the database expands.     Melvyn Novas, MD     RMD/MEDQ  D:  10/05/2012  T:  10/06/2012  Job:  [id number]

## 2012-10-06 NOTE — Clinical Social Work Note (Signed)
[facility] met w patient today, continue to be willing to take patient back at discharge. On weekend, if family cannot transport patient, please call facility between 58 [phone] to request transport help.    Santa Genera, LCSW Clinical Social Worker [phone])

## 2012-10-07 DIAGNOSIS — K259 Gastric ulcer, unspecified as acute or chronic, without hemorrhage or perforation: Secondary | ICD-10-CM

## 2012-10-07 NOTE — Progress Notes (Signed)
[id number] 

## 2012-10-07 NOTE — Progress Notes (Signed)
NAMECAROLYNN, [name] [name] NO.:  [id number]  MEDICAL RECORD NO.:  [id number]  LOCATION:  A328                          FACILITY:  [facility]  PHYSICIAN:  Melvyn Novas, MDDATE OF BIRTH:  1925-11-18  DATE OF PROCEDURE: DATE OF DISCHARGE:                                PROGRESS NOTE   The patient given additional 2 units of packed cells, total of 4, had large gastric ulcer per EGD with profound anemia.  Hemoglobin now up to 11.1.  The patient has profound dementia, hypertension, report of NSAID usage.  Lungs are clear.  No rales, wheeze, or rhonchi. Heart, regular rhythm.  No murmurs, gallops, or rubs.  Blood pressure 106/91, pulse 45 and regular, temperature 97.2, respiratory rate is 16, O2 sat 98%.  Plan right now is to continue PPI twice a day, currently on clear liquids.  We will advance to full liquids and see how she tolerates this.     Melvyn Novas, MD     RMD/MEDQ  D:  10/06/2012  T:  10/07/2012  Job:  [id number]

## 2012-10-07 NOTE — Progress Notes (Signed)
[name] [name], [name] [name] NO.:  [id number]  MEDICAL RECORD NO.:  [id number]  LOCATION:  A328                          FACILITY:  [facility]  PHYSICIAN:  Melvyn Novas, MDDATE OF BIRTH:  November 03, 1925  DATE OF PROCEDURE: DATE OF DISCHARGE:                                PROGRESS NOTE   ADDENDUM:  The patient does likewise have moderate protein-calorie malnutrition manifested by significant weight loss over the preceding months.  For this she had dietary consult with increased dietary supplementation of protein.     Melvyn Novas, MD     RMD/MEDQ  D:  10/06/2012  T:  10/07/2012  Job:  [id number]

## 2012-10-07 NOTE — Progress Notes (Signed)
[name] [name], [name] [name] NO.:  [id number]  MEDICAL RECORD NO.:  [id number]  LOCATION:  A328                          FACILITY:  [facility]  PHYSICIAN:  Melvyn Novas, MDDATE OF BIRTH:  18-Feb-1926  DATE OF PROCEDURE:  10/07/2012 DATE OF DISCHARGE:                                PROGRESS NOTE   The patient had gastric ulcers, severe bleeding and anemia secondary to that with hemoglobin of 5, transfused 4 units of packed cells.  EGD revealing this currently.  She is on Protonix IV 40 b.i.d.  Diet advanced today to dysphagia 3.  She has tolerated full liquids yesterday.  Hemoglobin 11.1.  Blood pressure 127/68, temperature 97.7, pulse 80 and regular, respiratory rate 16.  Lungs are clear.  No rales, wheeze, rhonchi. Heart has regular rhythm.  No murmurs, gallops, or rubs.  Abdomen is soft, nontender.  Bowel sounds normoactive.  Advance diet to dysphagia 3.  If she tolerates it, we will consider discharge within 24-48 hours if she tolerates feeding well.     Melvyn Novas, MD     RMD/MEDQ  D:  10/07/2012  T:  10/07/2012  Job:  [id number]

## 2012-10-07 NOTE — Progress Notes (Signed)
Doesn't offer any complaints  Nursing staff states she is tolerating a dysphagia 3 diet fairly well. No hematochezia, melena or hematemesis    Vital signs in last 24 hours: Temp:  [97.7 F (36.5 C)] 97.7 F (36.5 C) (05/09 2150) Pulse Rate:  [79-80] 79 (05/10 0716) Resp:  [15-16] 15 (05/10 0716) BP: (127)/(68) 127/68 mmHg (05/09 2150) SpO2:  [95 %] 95 % (05/10 0716) Last BM Date: 10/05/12 General:   Awake. Mumbles a bit. This oriented  Abdomen:  Nondistended Normal bowel sounds, without guarding, and without rebound.  No mass or organomegaly. Extremities:  Without clubbing or edema.    Intake/Output from previous day: 05/09 0701 - 05/10 0700 In: 7481.7 [P.O.:600; I.V.:6881.7] Out: -  Intake/Output this shift:    Lab Results:  Recent Labs  10/04/12 2258 10/05/12 0546 10/06/12 0543  HGB 8.1* 7.7* 11.1*  HCT 24.4* 23.6* 33.5*      Impression:  HP/NSAID-induced peptic ulcer disease  Recommendations:  Home on twice a day proton pump inhibitor therapy and Pylera a x10 days (continue twice a day proton pump inhibitor therapy un-interrupted for 3 months.  I will arrange a followup EGD in 3 months to verify ulcer healing. Thanks for consultation.

## 2012-10-08 MED ORDER — PANTOPRAZOLE SODIUM 40 MG PO TBEC
40.0000 mg | DELAYED_RELEASE_TABLET | Freq: Two times a day (BID) | ORAL | Status: DC
Start: 2012-10-08 — End: 2012-10-08

## 2012-10-08 MED ORDER — PANTOPRAZOLE SODIUM 40 MG PO TBEC: 40.0000 mg | DELAYED_RELEASE_TABLET | Freq: Two times a day (BID) | ORAL | Status: AC

## 2012-10-08 MED ORDER — POLYSACCHARIDE IRON COMPLEX 150 MG PO CAPS: 150.0000 mg | ORAL_CAPSULE | Freq: Every day | ORAL | Status: AC

## 2012-10-08 NOTE — Discharge Summary (Signed)
[id number] 

## 2012-10-08 NOTE — Progress Notes (Signed)
Patient with orders to be discharge back to [facility]. Discharge instructions reviewed with daughter, verbalized understanding. Report called to [facility], nurse. Patient in stable condition upon discharge. Patient transported in private vehicle by daughter.

## 2012-10-08 NOTE — Discharge Summary (Signed)
[id number] 

## 2012-10-08 NOTE — Discharge Summary (Signed)
[name] [name], [name] [name] NO.:  [id number]  MEDICAL RECORD NO.:  [id number]  LOCATION:  A328                          FACILITY:  [facility]  PHYSICIAN:  Melvyn Novas, MDDATE OF BIRTH:  1925-09-23  DATE OF ADMISSION:  10/03/2012 DATE OF DISCHARGE:  LH                         DISCHARGE SUMMARY-REFERRING   An 79 year old demented lady from nursing home with a history of hypertension, pleasantly demented, and basically had generic blood work, found to have a hemoglobin of 5.9, and heme-positive stools, was scoped. Had an EGD revealing a large gastric ulcer with bifurcation of duodenum, possibly from old gastric surgery.  History is unreliable at present. She was placed on clear liquids, advanced to full liquids and then dysphagia 3 diet.  She was subsequently transfused 2 units initially for stabilization and additional 2 units, total of 4.  She was hemodynamically stable throughout the hospital stay.  Her hemoglobin was 11.1.  On the day before discharge, she had some sundowning and confusion.  It was felt that she reached her maximal therapeutic benefit of hospital and to get her back to the nursing center to reorient her. Her discharge medicines included Protonix 40 mg p.o. b.i.d., Niferex 150 mg p.o. daily, Norvasc 5 mg p.o. daily.  She was told not to take NSAIDs or her opioid analgesia.  She was told that she may take milk of magnesia for constipation and lactulose 10 g t.i.d. p.r.n. for constipation and/or hyperammonemia.  She will follow up in the office in 1 week's time to check clinical symptomatology, blood loss, hemoglobin, and hematocrit.     Melvyn Novas, MD     RMD/MEDQ  D:  10/08/2012  T:  10/08/2012  Job:  [id number]

## 2012-10-08 NOTE — Discharge Summary (Signed)
NAMEELIANIS, [name] [name] NO.:  [id number]  MEDICAL RECORD NO.:  [id number]  LOCATION:  A328                          FACILITY:  [facility]  PHYSICIAN:  Melvyn Novas, MDDATE OF BIRTH:  September 24, 1925  DATE OF ADMISSION:  10/03/2012 DATE OF DISCHARGE:  05/11/2014LH                              DISCHARGE SUMMARY   The patient is an 79 year old white female, who is pleasantly demented to a moderate degree who was seen in my office.  The patient had blood work drawn revealing a hemoglobin of 5.9.  She was heme positive in her stools.  She was sent to the hospital for admission.  She had a colonoscopy 2-3 years ago, did show some diverticulosis.  No evidence of carcinoma.  It was elected to perform an EGD revealing a large gastric ulcer with a bifurcation of her duodenum possibly consistent with old previous gastric surgery not certain.  She was treated in a customary manner with transfusion of 2 units of blood initially and then a subsequent additional 2 units, total of 4.  Hemoglobin was 11.1, she felt better, was less dizzy; however, did not alter her mental status. She was subsequently given clear liquid diet, advanced to full liquids, and then __________ that she tolerated well with no further evidence of bleed or drop in hemoglobin and she was hemodynamically stable.  She was subsequently discharged on the following medicines, Niferex 150 mg p.o. daily, Synthroid 75 mcg p.o. daily, Namenda 10 mg p.o. b.i.d., Pravachol 40 mg p.o. daily, thiamine 100 mg p.o. daily, Norvasc 5 mg p.o. daily, Chronulac, lactulose syrup 10 g by mouth 3 times a day, and milk of magnesia 30 mL t.i.d. p.r.n. for constipation.  In addition, she was given Protonix 40 mg p.o. b.i.d. to take.  She will follow up in the office in 10 days' time for checking hemoglobin and if there is evidence of further bleed.     Melvyn Novas, MD     RMD/MEDQ  D:  10/08/2012  T:   10/08/2012  Job:  [id number]

## 2012-10-10 ENCOUNTER — Encounter: Payer: Self-pay | Admitting: Internal Medicine

## 2012-10-10 ENCOUNTER — Telehealth: Payer: Self-pay

## 2012-10-10 NOTE — Telephone Encounter (Signed)
Need treatment orders for pts hpylori please . Pt is allergic to PCN

## 2012-10-13 MED ORDER — BIS SUBCIT-METRONID-TETRACYC 140-125-125 MG PO CAPS: 3.0000 | ORAL_CAPSULE | Freq: Three times a day (TID) | ORAL | Status: DC

## 2012-10-13 NOTE — Telephone Encounter (Signed)
Patient needs to be on Pylera-3 capsules 4 times a day x10 days. Please call in a prescription. No refills. Should continue Protonix  2 times a day for 3 months. Need office visit in about 10-12 weeks to set up a followup EGD with [facility].

## 2012-10-13 NOTE — Telephone Encounter (Signed)
Whitney at [facility] is aware. Rx printed and faxed to them with this note.

## 2012-10-13 NOTE — Telephone Encounter (Signed)
Darl Pikes, please schedule pt appt.

## 2012-10-16 NOTE — Telephone Encounter (Signed)
Spoke with Sherrine Maples at [facility], informed him pt was allergic to pcn and we will need to do PA

## 2012-10-16 NOTE — Telephone Encounter (Signed)
PA sent to insurance company. 

## 2012-10-16 NOTE — Telephone Encounter (Signed)
Insurance [phone]  ID# [id number]

## 2012-10-16 NOTE — Telephone Encounter (Signed)
[facility] called and the patient's insurance will not cover the Pylera.  Please advise?

## 2012-10-18 NOTE — Telephone Encounter (Signed)
Pt has OV on 7/22 at 11 with [facility]

## 2012-11-12 ENCOUNTER — Emergency Department (HOSPITAL_COMMUNITY): Payer: Medicare Other

## 2012-11-12 ENCOUNTER — Encounter (HOSPITAL_COMMUNITY): Payer: Self-pay | Admitting: *Deleted

## 2012-11-12 ENCOUNTER — Emergency Department (HOSPITAL_COMMUNITY)
Admission: EM | Admit: 2012-11-12 | Discharge: 2012-11-12 | Disposition: A | Discharge status: Skilled Nursing Facility | Payer: Medicare Other | Attending: Emergency Medicine | Admitting: Emergency Medicine

## 2012-11-12 DIAGNOSIS — J4489 Other specified chronic obstructive pulmonary disease: Secondary | ICD-10-CM | POA: Insufficient documentation

## 2012-11-12 DIAGNOSIS — Z87891 Personal history of nicotine dependence: Secondary | ICD-10-CM | POA: Insufficient documentation

## 2012-11-12 DIAGNOSIS — W19XXXA Unspecified fall, initial encounter: Secondary | ICD-10-CM | POA: Insufficient documentation

## 2012-11-12 DIAGNOSIS — Z862 Personal history of diseases of the blood and blood-forming organs and certain disorders involving the immune mechanism: Secondary | ICD-10-CM | POA: Insufficient documentation

## 2012-11-12 DIAGNOSIS — F0390 Unspecified dementia without behavioral disturbance: Secondary | ICD-10-CM

## 2012-11-12 DIAGNOSIS — I2510 Atherosclerotic heart disease of native coronary artery without angina pectoris: Secondary | ICD-10-CM | POA: Insufficient documentation

## 2012-11-12 DIAGNOSIS — S329XXA Fracture of unspecified parts of lumbosacral spine and pelvis, initial encounter for closed fracture: Secondary | ICD-10-CM | POA: Insufficient documentation

## 2012-11-12 DIAGNOSIS — J449 Chronic obstructive pulmonary disease, unspecified: Secondary | ICD-10-CM | POA: Insufficient documentation

## 2012-11-12 DIAGNOSIS — Z8639 Personal history of other endocrine, nutritional and metabolic disease: Secondary | ICD-10-CM | POA: Insufficient documentation

## 2012-11-12 DIAGNOSIS — Y939 Activity, unspecified: Secondary | ICD-10-CM | POA: Insufficient documentation

## 2012-11-12 DIAGNOSIS — Y9210 Unspecified residential institution as the place of occurrence of the external cause: Secondary | ICD-10-CM | POA: Insufficient documentation

## 2012-11-12 DIAGNOSIS — Z9181 History of falling: Secondary | ICD-10-CM | POA: Insufficient documentation

## 2012-11-12 NOTE — ED Provider Notes (Signed)
History     This chart was scribed for Hurman Horn, MD by Jiles Prows, ED Scribe. The patient was seen in room APA05/APA05 and the patient's care was started at 4:35 PM.  CSN: [id number]  Arrival date & time 11/12/12  1621  Chief Complaint  Patient presents with  . Fall   The history is provided by the patient, medical records and a relative. No language interpreter was used.   HPI Comments: [name] [name] is a 79 y.o. female who presents to the Emergency Department BIB EMS from [facility] because pt was found on the floor after a fall around 4 pm.  At the time, the home thought the only problem was an abrasion to the right elbow.  Now, the pt claims pain in right hip and right elbow.  Baseline mental status consisting of awake and alert and only following a few simple commands.  Other history unobtainable due to dementia.  [name] reports that she uses a wheelchair instead of ambulating.  She reports that she is normally able to help with the transfer from wheelchair to bed or wheelchair to chair, but other than that, pt does not stand.  Past Medical History  Diagnosis Date  . Dementia   . Falls frequently   . CAD (coronary artery disease)   . Hypothyroidism   . Meniere disease   . Hyperlipidemia   . Osteoporosis   . Back pain   . PONV (postoperative nausea and vomiting)   . COPD (chronic obstructive pulmonary disease)   . Anemia     Past Surgical History  Procedure Laterality Date  . Colonoscopy  Oct 2008    Dr. Darrick Penna: pancolonic diverticulosis, tubular adenomas, due for surveillance in 5 years  . Esophagogastroduodenoscopy N/A 10/04/2012    Procedure: ESOPHAGOGASTRODUODENOSCOPY (EGD);  Surgeon: Corbin Ade, MD;  Location: [facility];  Service: Endoscopy;  Laterality: N/A;    Family History  Problem Relation Age of Onset  . Stroke Mother   . Diabetes Mother   . Heart disease Father   . Heart disease Sister   . Diabetes Sister    History  Substance Use  Topics  . Smoking status: Former Smoker -- 1.00 packs/day for 60 years    Types: Cigarettes    Quit date: 05/31/2011  . Smokeless tobacco: Never Used  . Alcohol Use: No   OB History   Grav Para Term Preterm Abortions TAB SAB Ect Mult Living                 Review of Systems  Unable to perform ROS: Dementia   Allergies  Codeine; Penicillins; and Sulfonamide derivatives  Home Medications  Reviewed.  Triage Vitals: BP 134/55  Pulse 72  Temp(Src) 98.6 F (37 C) (Oral)  Wt 125 lb (56.7 kg)  BMI 20.8 kg/m2  SpO2 95%  Physical Exam  Nursing note and vitals reviewed. Constitutional:  Awake, alert, nontoxic appearance.  HENT:  Head: Atraumatic.  Rt ear full of cerumen.  Left ear full of hard cerumen.  Eyes: Right eye exhibits no discharge. Left eye exhibits no discharge.  Neck: Neck supple.  Cardiovascular: Normal rate, regular rhythm and normal heart sounds.   No murmur heard. Pulmonary/Chest: Effort normal and breath sounds normal. She exhibits no tenderness.  Abdominal: Soft. Bowel sounds are normal. There is no tenderness. There is no rebound.  Musculoskeletal: She exhibits no tenderness.  Baseline ROM, no obvious new focal weakness. Neck and back nontender.  Left  arm and left leg nontender.  Right leg only tender at rt hip during passive ROM.  Right arm nontender except for right elbow which is minimally tender.   Neurological:  Mental status and motor strength appears baseline for patient and situation.  Skin: No rash noted.  Superficial skin to right elbow.  Psychiatric: She has a normal mood and affect.   ED Course  Procedures (including critical care time) DIAGNOSTIC STUDIES: Oxygen Saturation is 95% on RA, normal by my interpretation.    COORDINATION OF CARE: Family understands and agrees with initial ED impression and plan with expectations set for ED visit.  CT of head, and right elbow and hip x-rays ordered.   6:01 PM - Recheck to see if pt can bear  weight on her rt side.  Right hip pain precluded full weight bearing.  Will CT pelvis.  7:48 PM - Discussed broken pelvis with family.Patient / Family / Caregiver informed of clinical course, understand medical decision-making process, and agree with plan.  Labs Reviewed - No data to display Dg Elbow Complete Right  11/12/2012   *RADIOLOGY REPORT*  Clinical Data: Fall.  Pain right elbow.  Laceration posterior proximal radius region.  RIGHT ELBOW - COMPLETE 3+ VIEW  Comparison: Right elbow radiographs 08/26/2011  Findings: Bony mineralization is decreased.  Elbow is located.  No acute fracture or joint effusion is identified.  No significant degenerative changes.  No radiopaque foreign body or discrete soft tissue swelling appreciated.  IMPRESSION: No acute bony abnormality or joint effusion.  Decreased bony mineralization.   Original Report Authenticated By: Britta Mccreedy, M.D.   Dg Hip Complete Right  11/12/2012   *RADIOLOGY REPORT*  Clinical Data: Follow-up right hip pain.  RIGHT HIP - COMPLETE 2+ VIEW  Comparison: Pelvis right hip radiographs 04/22/2011  Findings: The bones are diffusely osteopenic. There are postoperative changes of dynamic hip screw placement on the left. The distal aspect of the hardware is not included on the image. Visualized portion of the dynamic hip screw appears stable and shows no evidence of hardware complication.  The contour of the proximal right femur appears able and intact. No acute fracture of the proximal right femur is identified.  No acute pelvic fracture or diastasis is identified.  Bowel gas projects diffusely over the sacrum and both iliac bones.  IMPRESSION:  1.  No acute bony abnormality. 2.  Osteopenia.   Original Report Authenticated By: Britta Mccreedy, M.D.   Ct Head Wo Contrast  11/12/2012   *RADIOLOGY REPORT*  Clinical Data: Fall  CT HEAD WITHOUT CONTRAST  Technique:  Contiguous axial images were obtained from the base of the skull through the vertex without  contrast.  Comparison: 09/09/2012  Findings: No skull fracture is noted.  No intracranial hemorrhage, mass effect or midline shift.  Paranasal sinuses and mastoid air cells are unremarkable.  Stable mild cerebral atrophy.  No acute cortical infarction.  No mass lesion is noted on this unenhanced scan.  IMPRESSION: No acute intracranial abnormality.  Stable cerebral atrophy.   Original Report Authenticated By: Natasha Mead, M.D.   Ct Pelvis Wo Contrast  11/12/2012   *RADIOLOGY REPORT*  Clinical Data: Fall.  Right hip pain  CT PELVIS WITHOUT CONTRAST  Technique:  Multidetector CT imaging of the pelvis was performed following the standard protocol without intravenous contrast.  Comparison: Radiographs 11/12/2012  Findings: There is a fracture of the pubic bone on the right involving the pubis and superior pubic ramus.  This is mildly displaced. The  inferior pubic ramus is intact.  The acetabulum and right hip joint are normal.  No fracture of the right femur.  No fracture of the sacrum is identified.  No other acute pelvic fracture.  There is a compression screw in the left femoral neck in satisfactory position.  IMPRESSION: Mildly displaced fracture of the pubic bone on the right.   Original Report Authenticated By: Janeece Riggers, M.D.   1. Pelvic fracture, closed, initial encounter   2. Dementia, without behavioral disturbance   3. Fall, initial encounter     MDM  I doubt any other EMC precluding discharge at this time including, but not necessarily limited to the following: fracture requiring intervention or admission    I personally performed the services described in this documentation, which was scribed in my presence. The recorded information has been reviewed and is accurate.    Hurman Horn, MD 11/13/12 [phone]

## 2012-11-12 NOTE — ED Notes (Signed)
Pt was found on floor at the [facility] where she resides at approx 4pm, per EMS. Pt noted to has rt elbow skin tear and pain in rt elbow. No other injures noted at this time.

## 2012-11-12 NOTE — ED Notes (Signed)
EDP At bedside discussing plan of care with pt and family member. EDP attempted to get pt out of bed to stand pt at bedside. Pt continues to c/o rt hip pain.

## 2012-11-12 NOTE — ED Notes (Signed)
Wound care and dressing applied by tech. Pt tolerated well.

## 2012-11-12 NOTE — ED Notes (Signed)
Pt c/o rt elbow pain after fall un-witnessed fall at nursing home. Pt c/o pain when moving Rt elbow. EMS reports a skin tear to said elbow, elbow is wrapped with guaze. NAD noted

## 2012-11-16 ENCOUNTER — Emergency Department (HOSPITAL_COMMUNITY): Payer: Medicare Other

## 2012-11-16 ENCOUNTER — Encounter (HOSPITAL_COMMUNITY): Payer: Self-pay

## 2012-11-16 ENCOUNTER — Inpatient Hospital Stay (HOSPITAL_COMMUNITY)
Admission: EM | Admit: 2012-11-16 | Discharge: 2012-11-20 | DRG: 177 | Disposition: A | Discharge status: Nursing Home (Includes ICF) | Payer: Medicare Other | Attending: Family Medicine | Admitting: Family Medicine

## 2012-11-16 DIAGNOSIS — S32509A Unspecified fracture of unspecified pubis, initial encounter for closed fracture: Secondary | ICD-10-CM | POA: Diagnosis present

## 2012-11-16 DIAGNOSIS — E039 Hypothyroidism, unspecified: Secondary | ICD-10-CM | POA: Diagnosis present

## 2012-11-16 DIAGNOSIS — E785 Hyperlipidemia, unspecified: Secondary | ICD-10-CM | POA: Diagnosis present

## 2012-11-16 DIAGNOSIS — I2510 Atherosclerotic heart disease of native coronary artery without angina pectoris: Secondary | ICD-10-CM | POA: Diagnosis present

## 2012-11-16 DIAGNOSIS — J4489 Other specified chronic obstructive pulmonary disease: Secondary | ICD-10-CM | POA: Diagnosis present

## 2012-11-16 DIAGNOSIS — Z87891 Personal history of nicotine dependence: Secondary | ICD-10-CM

## 2012-11-16 DIAGNOSIS — W19XXXA Unspecified fall, initial encounter: Secondary | ICD-10-CM | POA: Diagnosis present

## 2012-11-16 DIAGNOSIS — Z66 Do not resuscitate: Secondary | ICD-10-CM | POA: Diagnosis present

## 2012-11-16 DIAGNOSIS — F0390 Unspecified dementia without behavioral disturbance: Secondary | ICD-10-CM | POA: Diagnosis present

## 2012-11-16 DIAGNOSIS — M810 Age-related osteoporosis without current pathological fracture: Secondary | ICD-10-CM | POA: Diagnosis present

## 2012-11-16 DIAGNOSIS — D649 Anemia, unspecified: Secondary | ICD-10-CM | POA: Diagnosis present

## 2012-11-16 DIAGNOSIS — G9340 Encephalopathy, unspecified: Secondary | ICD-10-CM | POA: Diagnosis present

## 2012-11-16 DIAGNOSIS — H8109 Meniere's disease, unspecified ear: Secondary | ICD-10-CM | POA: Diagnosis present

## 2012-11-16 DIAGNOSIS — S32000A Wedge compression fracture of unspecified lumbar vertebra, initial encounter for closed fracture: Secondary | ICD-10-CM

## 2012-11-16 DIAGNOSIS — Z79899 Other long term (current) drug therapy: Secondary | ICD-10-CM

## 2012-11-16 DIAGNOSIS — N390 Urinary tract infection, site not specified: Secondary | ICD-10-CM | POA: Diagnosis present

## 2012-11-16 DIAGNOSIS — Z9181 History of falling: Secondary | ICD-10-CM

## 2012-11-16 DIAGNOSIS — J690 Pneumonitis due to inhalation of food and vomit: Principal | ICD-10-CM | POA: Diagnosis present

## 2012-11-16 DIAGNOSIS — J449 Chronic obstructive pulmonary disease, unspecified: Secondary | ICD-10-CM | POA: Diagnosis present

## 2012-11-16 DIAGNOSIS — S329XXA Fracture of unspecified parts of lumbosacral spine and pelvis, initial encounter for closed fracture: Secondary | ICD-10-CM

## 2012-11-16 DIAGNOSIS — R0902 Hypoxemia: Secondary | ICD-10-CM

## 2012-11-16 LAB — COMPREHENSIVE METABOLIC PANEL
Albumin: 3.2 g/dL — ABNORMAL LOW (ref 3.5–5.2)
Alkaline Phosphatase: 77 U/L (ref 39–117)
BUN: 32 mg/dL — ABNORMAL HIGH (ref 6–23)
Calcium: 9.5 mg/dL (ref 8.4–10.5)
Creatinine, Ser: 0.79 mg/dL (ref 0.50–1.10)
GFR calc Af Amer: 86 mL/min — ABNORMAL LOW (ref >90)
Glucose, Bld: 113 mg/dL — ABNORMAL HIGH (ref 70–99)
Total Protein: 7.5 g/dL (ref 6.0–8.3)

## 2012-11-16 LAB — BLOOD GAS, ARTERIAL
Drawn by: 23534
O2 Content: 4 L/min
O2 Saturation: 98.2 %
Patient temperature: 37
pH, Arterial: 7.394 (ref 7.350–7.450)
pO2, Arterial: 107 mmHg — ABNORMAL HIGH (ref 80.0–100.0)

## 2012-11-16 LAB — CBC WITH DIFFERENTIAL/PLATELET
Basophils Relative: 0 % (ref 0–1)
Eosinophils Absolute: 0.3 10*3/uL (ref 0.0–0.7)
Eosinophils Relative: 3 % (ref 0–5)
Hemoglobin: 9.1 g/dL — ABNORMAL LOW (ref 12.0–15.0)
Lymphs Abs: 2.6 10*3/uL (ref 0.7–4.0)
MCH: 26.5 pg (ref 26.0–34.0)
MCHC: 31.4 g/dL (ref 30.0–36.0)
MCV: 84.3 fL (ref 78.0–100.0)
Monocytes Relative: 7 % (ref 3–12)
Neutrophils Relative %: 61 % (ref 43–77)
RBC: 3.44 MIL/uL — ABNORMAL LOW (ref 3.87–5.11)

## 2012-11-16 LAB — TROPONIN I: Troponin I: <0.3 ng/mL (ref <0.30)

## 2012-11-16 LAB — URINALYSIS, ROUTINE W REFLEX MICROSCOPIC
Nitrite: POSITIVE — AB
Specific Gravity, Urine: >1.03 — ABNORMAL HIGH (ref 1.005–1.030)
Urobilinogen, UA: 0.2 mg/dL (ref 0.0–1.0)

## 2012-11-16 LAB — PROTIME-INR
INR: 0.99 (ref 0.00–1.49)
Prothrombin Time: 13 seconds (ref 11.6–15.2)

## 2012-11-16 LAB — PRO B NATRIURETIC PEPTIDE: Pro B Natriuretic peptide (BNP): 307 pg/mL (ref 0–450)

## 2012-11-16 LAB — LACTIC ACID, PLASMA: Lactic Acid, Venous: 0.9 mmol/L (ref 0.5–2.2)

## 2012-11-16 MED ORDER — ENOXAPARIN SODIUM 40 MG/0.4ML ~~LOC~~ SOLN
40.0000 mg | SUBCUTANEOUS | Status: DC
  Administered 2012-11-17 – 2012-11-20 (×4): 40 mg via SUBCUTANEOUS
  Filled 2012-11-16 (×3): qty 0.4

## 2012-11-16 MED ORDER — LEVOFLOXACIN IN D5W 500 MG/100ML IV SOLN
500.0000 mg | INTRAVENOUS | Status: DC
  Administered 2012-11-17: 500 mg via INTRAVENOUS
  Filled 2012-11-16 (×4): qty 100

## 2012-11-16 MED ORDER — ONDANSETRON HCL 4 MG PO TABS: 4.0000 mg | ORAL_TABLET | Freq: Four times a day (QID) | ORAL | Status: DC | PRN

## 2012-11-16 MED ORDER — LEVOFLOXACIN IN D5W 500 MG/100ML IV SOLN: 500.0000 mg | Freq: Once | INTRAVENOUS | Status: DC

## 2012-11-16 MED ORDER — SODIUM CHLORIDE 0.9 % IV SOLN
INTRAVENOUS | Status: AC
  Administered 2012-11-17: via INTRAVENOUS

## 2012-11-16 MED ORDER — ACETAMINOPHEN 325 MG PO TABS
650.0000 mg | ORAL_TABLET | Freq: Four times a day (QID) | ORAL | Status: DC | PRN
  Administered 2012-11-17: 650 mg via ORAL
  Filled 2012-11-16: qty 2

## 2012-11-16 MED ORDER — ALBUTEROL SULFATE (5 MG/ML) 0.5% IN NEBU: 2.5000 mg | INHALATION_SOLUTION | RESPIRATORY_TRACT | Status: DC | PRN

## 2012-11-16 MED ORDER — DEXTROSE 5 % IV SOLN
1.0000 g | Freq: Two times a day (BID) | INTRAVENOUS | Status: DC
  Filled 2012-11-16 (×4): qty 1

## 2012-11-16 MED ORDER — ONDANSETRON HCL 4 MG/2ML IJ SOLN: 4.0000 mg | Freq: Four times a day (QID) | INTRAMUSCULAR | Status: DC | PRN

## 2012-11-16 MED ORDER — NALOXONE HCL 0.4 MG/ML IJ SOLN
0.4000 mg | Freq: Once | INTRAMUSCULAR | Status: DC
  Filled 2012-11-16: qty 1

## 2012-11-16 MED ORDER — VANCOMYCIN HCL IN DEXTROSE 1-5 GM/200ML-% IV SOLN
1000.0000 mg | Freq: Once | INTRAVENOUS | Status: AC
  Administered 2012-11-16: 1000 mg via INTRAVENOUS
  Filled 2012-11-16: qty 200

## 2012-11-16 MED ORDER — ACETAMINOPHEN 650 MG RE SUPP
650.0000 mg | Freq: Four times a day (QID) | RECTAL | Status: DC | PRN
  Administered 2012-11-18 – 2012-11-19 (×2): 650 mg via RECTAL
  Filled 2012-11-16 (×2): qty 1

## 2012-11-16 MED ORDER — ALBUTEROL SULFATE (5 MG/ML) 0.5% IN NEBU
2.5000 mg | INHALATION_SOLUTION | Freq: Four times a day (QID) | RESPIRATORY_TRACT | Status: DC
  Administered 2012-11-17 – 2012-11-20 (×11): 2.5 mg via RESPIRATORY_TRACT
  Filled 2012-11-16 (×12): qty 0.5

## 2012-11-16 MED ORDER — DEXTROSE 5 % IV SOLN: 1.0000 g | Freq: Once | INTRAVENOUS | Status: DC

## 2012-11-16 MED ORDER — DEXTROSE 5 % IV SOLN: 500.0000 mg | Freq: Once | INTRAVENOUS | Status: DC

## 2012-11-16 NOTE — H&P (Signed)
[facility] History and Physical  [name] [name] ZOX:[id number] DOB: Jun 10, 1925 DOA: 11/16/2012   PCP: [name], MD   Chief Complaint: Shortness of breath, and altered mental status  HPI: [name] [name] is a 79 y.o. female with a past medical history of dementia, osteoporosis, frequent falls, hypothyroidism, who lives in an assisted living facility. She gets around in a wheelchair. She was seen few days ago in the ED, after she sustained a fall and was found to have a pelvic fracture. She was prescribed Ultram and was discharged home. Patient has has declined since then. Today she was found to be short of breath. She was confused. She was subsequently brought into the hospital. She was found to be wheezing and then started having a rattling sound from her chest. She was admitted back in May for severe anemia and was found to have gastric ulcer. H. pylori was positive and she was given treatment for the same. Her daughter is at the, bedside, and she tells me that her mental status is close to baseline at this time. Patient has dementia. She doesn't respond at this time, and is unable to provide any history.  Home Medications:  Prior to Admission medications   Medication Sig Start Date End Date Taking? Authorizing Provider  amLODipine (NORVASC) 5 MG tablet Take 5 mg by mouth daily.   Yes Historical Provider, MD  iron polysaccharides (NIFEREX) 150 MG capsule Take 1 capsule (150 mg total) by mouth daily. 10/08/12  Yes [name], MD  lactulose (CHRONULAC) 10 GM/15ML solution Take 20 g by mouth 3 (three) times daily.    Yes Historical Provider, MD  levothyroxine (SYNTHROID, LEVOTHROID) 75 MCG tablet Take 75 mcg by mouth daily.     Yes Historical Provider, MD  memantine (NAMENDA) 10 MG tablet Take 10 mg by mouth 2 (two) times daily.    Yes Historical Provider, MD  pantoprazole (PROTONIX) 40 MG tablet Take 1 tablet (40 mg total) by mouth 2 (two) times daily before a meal. 10/08/12   Yes [name], MD  pravastatin (PRAVACHOL) 40 MG tablet Take 40 mg by mouth at bedtime.    Yes Historical Provider, MD  Thiamine HCl (VITAMIN B-1) 100 MG tablet Take 100 mg by mouth daily.     Yes Historical Provider, MD  UNABLE TO FIND Take 1 Can by mouth every morning. Med Name: Sherryle Lis nutritional suppl   Yes Historical Provider, MD    Allergies:  Allergies  Allergen Reactions  . Codeine   . Penicillins   . Sulfonamide Derivatives     Past Medical History: Past Medical History  Diagnosis Date  . Dementia   . Falls frequently   . CAD (coronary artery disease)   . Hypothyroidism   . Meniere disease   . Hyperlipidemia   . Osteoporosis   . Back pain   . PONV (postoperative nausea and vomiting)   . COPD (chronic obstructive pulmonary disease)   . Anemia     Past Surgical History  Procedure Laterality Date  . Colonoscopy  Oct 2008    Dr. Darrick Penna: pancolonic diverticulosis, tubular adenomas, due for surveillance in 5 years  . Esophagogastroduodenoscopy N/A 10/04/2012    Procedure: ESOPHAGOGASTRODUODENOSCOPY (EGD);  Surgeon: Corbin Ade, MD;  Location[facility];  Service: Endoscopy;  Laterality: N/A;    Social History:  reports that she quit smoking about 17 months ago. Her smoking use included Cigarettes. She has a 60 pack-year smoking history. She has never used  smokeless tobacco. She reports that she does not drink alcohol or use illicit drugs.  Living Situation: Lives in an assisted living facility Activity Level: Uses a wheelchair to get around   Family History:  Family History  Problem Relation Age of Onset  . Stroke Mother   . Diabetes Mother   . Heart disease Father   . Heart disease Sister   . Diabetes Sister      Review of Systems - Unable to do due to altered mental status  Physical Examination  Filed Vitals:   11/16/12 1946 11/16/12 2000 11/16/12 2045  BP: 154/54 130/87   Pulse: 78 78 72  Temp: 98 F (36.7 C)    TempSrc: Rectal     Resp: 24 15 14   Weight: 56.7 kg (125 lb)    SpO2: 92% 91% 98%    General appearance: appears stated age, distracted and no distress Head: Normocephalic, without obvious abnormality, atraumatic Eyes: Pupils are small but reactive to light. Throat: Dry mm Resp: Bilaterally with a few crackles at the bases. A few wheezes are also appreciated Cardio: regular rate and rhythm, S1, S2 normal, no murmur, click, rub or gallop GI: soft, non-tender; bowel sounds normal; no masses,  no organomegaly Extremities: extremities normal, atraumatic, no cyanosis or edema Pulses: 2+ and symmetric Skin: Skin color, texture, turgor normal. No rashes or lesions Neurologic: Opens her eyes but does not follow commands. Moving her extremities.  Laboratory Data: Results for orders placed during the hospital encounter of 11/16/12 (from the past 48 hour(s))  CULTURE, BLOOD (ROUTINE X 2)     Status: None   Collection Time    11/16/12  7:40 PM      Result Value Range   Specimen Description BLOOD LEFT HAND     Special Requests       Value: BOTTLES DRAWN AEROBIC AND ANAEROBIC AEB 7CC ANA 6CC   Culture PENDING     Report Status PENDING    LACTIC ACID, PLASMA     Status: None   Collection Time    11/16/12  7:40 PM      Result Value Range   Lactic Acid, Venous 0.9  0.5 - 2.2 mmol/L  CBC WITH DIFFERENTIAL     Status: Abnormal   Collection Time    11/16/12  7:56 PM      Result Value Range   WBC 9.2  4.0 - 10.5 K/uL   RBC 3.44 (*) 3.87 - 5.11 MIL/uL   Hemoglobin 9.1 (*) 12.0 - 15.0 g/dL   HCT 40.9 (*) 81.1 - 91.4 %   MCV 84.3  78.0 - 100.0 fL   MCH 26.5  26.0 - 34.0 pg   MCHC 31.4  30.0 - 36.0 g/dL   RDW 78.2 (*) 95.6 - 21.3 %   Platelets 186  150 - 400 K/uL   Neutrophils Relative % 61  43 - 77 %   Neutro Abs 5.5  1.7 - 7.7 K/uL   Lymphocytes Relative 29  12 - 46 %   Lymphs Abs 2.6  0.7 - 4.0 K/uL   Monocytes Relative 7  3 - 12 %   Monocytes Absolute 0.7  0.1 - 1.0 K/uL   Eosinophils Relative 3  0 - 5  %   Eosinophils Absolute 0.3  0.0 - 0.7 K/uL   Basophils Relative 0  0 - 1 %   Basophils Absolute 0.0  0.0 - 0.1 K/uL  COMPREHENSIVE METABOLIC PANEL     Status: Abnormal  Collection Time    11/16/12  7:56 PM      Result Value Range   Sodium 140  135 - 145 mEq/L   Potassium 4.0  3.5 - 5.1 mEq/L   Chloride 103  96 - 112 mEq/L   CO2 27  19 - 32 mEq/L   Glucose, Bld 113 (*) 70 - 99 mg/dL   BUN 32 (*) 6 - 23 mg/dL   Creatinine, Ser 1.61  0.50 - 1.10 mg/dL   Calcium 9.5  8.4 - 09.6 mg/dL   Total Protein 7.5  6.0 - 8.3 g/dL   Albumin 3.2 (*) 3.5 - 5.2 g/dL   AST 13  0 - 37 U/L   ALT 13  0 - 35 U/L   Alkaline Phosphatase 77  39 - 117 U/L   Total Bilirubin 0.6  0.3 - 1.2 mg/dL   GFR calc non Af Amer 74 (*) >90 mL/min   GFR calc Af Amer 86 (*) >90 mL/min   Comment:            The eGFR has been calculated     using the CKD EPI equation.     This calculation has not been     validated in all clinical     situations.     eGFR's persistently     <90 mL/min signify     possible Chronic Kidney Disease.  TROPONIN I     Status: None   Collection Time    11/16/12  7:56 PM      Result Value Range   Troponin I <0.30  <0.30 ng/mL   Comment:            Due to the release kinetics of cTnI,     a negative result within the first hours     of the onset of symptoms does not rule out     myocardial infarction with certainty.     If myocardial infarction is still suspected,     repeat the test at appropriate intervals.  PROTIME-INR     Status: None   Collection Time    11/16/12  7:56 PM      Result Value Range   Prothrombin Time 13.0  11.6 - 15.2 seconds   INR 0.99  0.00 - 1.49  PRO B NATRIURETIC PEPTIDE     Status: None   Collection Time    11/16/12  7:56 PM      Result Value Range   Pro B Natriuretic peptide (BNP) 307.0  0 - 450 pg/mL  CULTURE, BLOOD (ROUTINE X 2)     Status: None   Collection Time    11/16/12  7:57 PM      Result Value Range   Specimen Description BLOOD LEFT ARM      Special Requests BOTTLES DRAWN AEROBIC AND ANAEROBIC 7CC EACH     Culture PENDING     Report Status PENDING    URINALYSIS, ROUTINE W REFLEX MICROSCOPIC     Status: Abnormal   Collection Time    11/16/12  8:31 PM      Result Value Range   Color, Urine YELLOW  YELLOW   APPearance HAZY (*) CLEAR   Specific Gravity, Urine >1.030 (*) 1.005 - 1.030   pH 5.5  5.0 - 8.0   Glucose, UA NEGATIVE  NEGATIVE mg/dL   Hgb urine dipstick SMALL (*) NEGATIVE   Bilirubin Urine NEGATIVE  NEGATIVE   Ketones, ur NEGATIVE  NEGATIVE mg/dL   Protein,  ur TRACE (*) NEGATIVE mg/dL   Urobilinogen, UA 0.2  0.0 - 1.0 mg/dL   Nitrite POSITIVE (*) NEGATIVE   Leukocytes, UA SMALL (*) NEGATIVE  BLOOD GAS, ARTERIAL     Status: Abnormal   Collection Time    11/16/12  9:50 PM      Result Value Range   O2 Content 4.0     Delivery systems NASAL CANNULA     pH, Arterial 7.394  7.350 - 7.450   pCO2 arterial 43.4  35.0 - 45.0 mmHg   pO2, Arterial 107.0 (*) 80.0 - 100.0 mmHg   Bicarbonate 26.0 (*) 20.0 - 24.0 mEq/L   TCO2 24.6  0 - 100 mmol/L   Acid-Base Excess 1.6  0.0 - 2.0 mmol/L   O2 Saturation 98.2     Patient temperature 37.0     Collection site LEFT RADIAL     Drawn by [id number]     Sample type ARTERIAL     Allens test (pass/fail) PASS  PASS    Radiology Reports: Ct Head Wo Contrast  11/16/2012   *RADIOLOGY REPORT*  Clinical Data: Shortness of breath.  History of fall.  CT HEAD WITHOUT CONTRAST  Technique:  Contiguous axial images were obtained from the base of the skull through the vertex without contrast.  Comparison: And CT 11/12/2012.  Findings: Mild cerebral atrophy.  Patchy and confluent areas of decreased attenuation throughout the deep and periventricular white matter of the cerebral hemispheres bilaterally, compatible with chronic microvascular ischemic disease. No acute displaced skull fractures are identified.  No acute intracranial abnormality. Specifically, no evidence of acute post-traumatic  intracranial hemorrhage, no definite regions of acute/subacute cerebral ischemia, no focal mass, mass effect, hydrocephalus or abnormal intra or extra-axial fluid collections.  The visualized paranasal sinuses and mastoids are well pneumatized.  IMPRESSION: 1.  No acute intracranial abnormalities. 2.  Mild cerebral atrophy and chronic microvascular ischemic changes in the cerebral white matter redemonstrated, as above.   Original Report Authenticated By: Trudie Reed, M.D.   Dg Chest Portable 1 View  11/16/2012   *RADIOLOGY REPORT*  Clinical Data: Short of breath  PORTABLE CHEST - 1 VIEW  Comparison: Prior chest x-ray 10/03/2012  Findings: Slightly increased pulmonary vascular congestion bordering on interstitial edema.  Mild bibasilar atelectasis. Stable cardiomegaly.  Atherosclerotic calcifications in the transverse aorta.  No acute osseous abnormality.  IMPRESSION: Slightly increased pulmonary vascular congestion now bordering on mild interstitial edema suggests mild CHF.   Original Report Authenticated By: Malachy Moan, M.D.    Electrocardiogram: Sinus rhythm at 77 beats per minute. Normal axis. Intervals are normal. No Q waves. No concerning ST or T-wave changes are noted.  Problem List  Principal Problem:   Acute encephalopathy Active Problems:   Dementia   Hypothyroidism   UTI (lower urinary tract infection)   Aspiration pneumonia   Assessment:  This is an 86-year -old, Caucasian female, who presents with shortness of breath, and confusion. Chest x-ray suggests bilateral infiltrates. BNP, however, is normal. This is most likely aspiration. She also has an abnormal, UA, and likely has UTI. Encephalopathy is likely due to the UTI, as well as pain medications.  Plan:  #1 acute encephalopathy: Most likely due to medications and UTI. We will monitor her on the floor. As her infection is treated mental status should improve. CT head has been done and results are pending.  #2 urinary  tract infection: Treat with Levaquin. Cultures will be followed up on.  #3 abnormal chest x-ray: Most likely  aspiration pneumonia: Swallow evaluation will be performed. Continue with Levaquin for now.  #4 anemia with recent peptic ulcer disease: Continue with PPI when she is able to take orally.  #5 history of hypothyroidism: Check TSH and free T4. Resume her Synthroid when she is able to take orally    DVT Prophylaxis: , Lovenox  Code Status:  CODE STATUS was discussed with her daughter. She'll be a DO NOT RESUSCITATE.  Family Communication:  discussed with the daughter at bedside  Disposition Plan: admitted to MedSurg bed. Dr. Janna Arch will assume care in the morning.   Further management decisions will depend on results of further testing and patient's response to treatment.  [facility]  [facility] Pager [phone]  If 7PM-7AM, please contact night-coverage [email or web address] Password [facility]  11/16/2012, 10:47 PM

## 2012-11-16 NOTE — ED Notes (Signed)
Pt from [facility] with c/o sob onset today

## 2012-11-16 NOTE — ED Provider Notes (Signed)
History     CSN: [id number]  Arrival date & time 11/16/12  1936   First MD Initiated Contact with Patient 11/16/12 1937      Chief Complaint  Patient presents with  . Shortness of Breath    (Consider location/radiation/quality/duration/timing/severity/associated sxs/prior treatment) HPI Comments: From ECF with shortness of breath that onset today.  Hypoxia to 80s on EMS arrival. Patient is demented and unable to provide a history. Reportedly had negative chest Xray today.  Patient was seen in this ED 4 days ago after a fall and found to have a pelvic fracture. Her medical history includes COPD, CAD, dementia. She is unable to give any history herself.  The history is provided by the patient, the EMS personnel and the nursing home. The history is limited by the condition of the patient.    Past Medical History  Diagnosis Date  . Dementia   . Falls frequently   . CAD (coronary artery disease)   . Hypothyroidism   . Meniere disease   . Hyperlipidemia   . Osteoporosis   . Back pain   . PONV (postoperative nausea and vomiting)   . COPD (chronic obstructive pulmonary disease)   . Anemia     Past Surgical History  Procedure Laterality Date  . Colonoscopy  Oct 2008    Dr. Darrick Penna: pancolonic diverticulosis, tubular adenomas, due for surveillance in 5 years  . Esophagogastroduodenoscopy N/A 10/04/2012    Procedure: ESOPHAGOGASTRODUODENOSCOPY (EGD);  Surgeon: Corbin Ade, MD;  Location: [facility];  Service: Endoscopy;  Laterality: N/A;    Family History  Problem Relation Age of Onset  . Stroke Mother   . Diabetes Mother   . Heart disease Father   . Heart disease Sister   . Diabetes Sister     History  Substance Use Topics  . Smoking status: Former Smoker -- 1.00 packs/day for 60 years    Types: Cigarettes    Quit date: 05/31/2011  . Smokeless tobacco: Never Used  . Alcohol Use: No    OB History   Grav Para Term Preterm Abortions TAB SAB Ect Mult Living                   Review of Systems  Unable to perform ROS: Dementia  Respiratory: Positive for shortness of breath.     Allergies  Codeine; Penicillins; and Sulfonamide derivatives  Home Medications     BP 130/87  Pulse 72  Temp(Src) 98 F (36.7 C) (Rectal)  Resp 14  Wt 125 lb (56.7 kg)  BMI 20.8 kg/m2  SpO2 98%  Physical Exam  Constitutional: She appears well-developed and well-nourished. She appears distressed.  Mildly increased work of breathing  HENT:  Head: Normocephalic and atraumatic.  Mouth/Throat: Oropharynx is clear and moist. No oropharyngeal exudate.  Dry mucous membranes  Eyes: Conjunctivae and EOM are normal. Pupils are equal, round, and reactive to light.  Neck: Normal range of motion. Neck supple.  Cardiovascular: Normal rate, regular rhythm and normal heart sounds.   No murmur heard. Pulmonary/Chest: Effort normal and breath sounds normal.  Mildly increased work of breathing with coarse breath sounds bilaterally  Abdominal: Soft. There is no tenderness. There is no rebound and no guarding.  Musculoskeletal: Normal range of motion. She exhibits no edema and no tenderness.  Right elbow skin tear, left shin abrasion  Neurological: She is alert.  Alert, moves all extremities spontaneously, does not follow commands  Skin: Skin is warm.    ED Course  Procedures (including critical care time)  Labs Reviewed  CBC WITH DIFFERENTIAL - Abnormal; Notable for the following:    RBC 3.44 (*)    Hemoglobin 9.1 (*)    HCT 29.0 (*)    RDW 20.5 (*)    All other components within normal limits  COMPREHENSIVE METABOLIC PANEL - Abnormal; Notable for the following:    Glucose, Bld 113 (*)    BUN 32 (*)    Albumin 3.2 (*)    GFR calc non Af Amer 74 (*)    GFR calc Af Amer 86 (*)    All other components within normal limits  URINALYSIS, ROUTINE W REFLEX MICROSCOPIC - Abnormal; Notable for the following:    APPearance HAZY (*)    Specific Gravity, Urine >1.030 (*)     Hgb urine dipstick SMALL (*)    Protein, ur TRACE (*)    Nitrite POSITIVE (*)    Leukocytes, UA SMALL (*)    All other components within normal limits  CULTURE, BLOOD (ROUTINE X 2)  CULTURE, BLOOD (ROUTINE X 2)  URINE CULTURE  TROPONIN I  PROTIME-INR  PRO B NATRIURETIC PEPTIDE  LACTIC ACID, PLASMA  URINE MICROSCOPIC-ADD ON  BLOOD GAS, ARTERIAL   Dg Chest Portable 1 View  11/16/2012   *RADIOLOGY REPORT*  Clinical Data: Short of breath  PORTABLE CHEST - 1 VIEW  Comparison: Prior chest x-ray 10/03/2012  Findings: Slightly increased pulmonary vascular congestion bordering on interstitial edema.  Mild bibasilar atelectasis. Stable cardiomegaly.  Atherosclerotic calcifications in the transverse aorta.  No acute osseous abnormality.  IMPRESSION: Slightly increased pulmonary vascular congestion now bordering on mild interstitial edema suggests mild CHF.   Original Report Authenticated By: Malachy Moan, M.D.     No diagnosis found.    MDM  From nursing home with shortness of breath and hypoxia that apparently onset today. Patient is demented and unable to give a history.  Coarse breath sounds bilaterally with no increased work of breathing. Concern for aspiration. Broad spectrum antibiotics started. X-ray shows interstitial edema. No history of CHF per records. No echocardiogram available. Presume aspiration. UA appears infected.  Patient's mental status has worsened since arrival in the ED. She became more somnolent and sleepy. She is arousable to pain and opens her eyes but does not follow commands. Her daughter has arrived and states she has been worsening over the past several days after being started on tramadol. Will repeat CT head and check ABG. Daughter states this been no family discussion about CODE STATUS.  No CO2 retention on ABG.  CT head stable. Will admit for presumed aspiration, new O2 requirement and UTI.   Date: 11/16/2012  Rate: 77  Rhythm: normal sinus  rhythm  QRS Axis: normal  Intervals: PR prolonged  ST/T Wave abnormalities: normal  Conduction Disutrbances:none  Narrative Interpretation:   Old EKG Reviewed: unchanged    Glynn Octave, MD 11/16/12 2256

## 2012-11-17 DIAGNOSIS — I517 Cardiomegaly: Secondary | ICD-10-CM

## 2012-11-17 LAB — T4, FREE: Free T4: 1.79 ng/dL (ref 0.80–1.80)

## 2012-11-17 LAB — HEPATIC FUNCTION PANEL
ALT: 12 U/L (ref 0–35)
AST: 13 U/L (ref 0–37)
Albumin: 2.8 g/dL — ABNORMAL LOW (ref 3.5–5.2)
Alkaline Phosphatase: 70 U/L (ref 39–117)
Total Protein: 7.2 g/dL (ref 6.0–8.3)

## 2012-11-17 LAB — COMPREHENSIVE METABOLIC PANEL
ALT: 10 U/L (ref 0–35)
AST: 11 U/L (ref 0–37)
Alkaline Phosphatase: 70 U/L (ref 39–117)
BUN: 29 mg/dL — ABNORMAL HIGH (ref 6–23)
Creatinine, Ser: 0.66 mg/dL (ref 0.50–1.10)
GFR calc non Af Amer: 79 mL/min — ABNORMAL LOW (ref >90)
Glucose, Bld: 117 mg/dL — ABNORMAL HIGH (ref 70–99)
Sodium: 138 mEq/L (ref 135–145)
Total Bilirubin: 0.6 mg/dL (ref 0.3–1.2)

## 2012-11-17 LAB — VITAMIN B12: Vitamin B-12: 251 pg/mL (ref 211–911)

## 2012-11-17 LAB — AMMONIA: Ammonia: 21 umol/L (ref 11–60)

## 2012-11-17 LAB — FOLATE: Folate: 7.4 ng/mL

## 2012-11-17 LAB — CBC
MCH: 26.4 pg (ref 26.0–34.0)
MCHC: 31.4 g/dL (ref 30.0–36.0)
Platelets: 169 10*3/uL (ref 150–400)
RDW: 20.4 % — ABNORMAL HIGH (ref 11.5–15.5)

## 2012-11-17 LAB — RETICULOCYTES: Retic Ct Pct: 3.1 % (ref 0.4–3.1)

## 2012-11-17 LAB — IRON AND TIBC
Iron: 58 ug/dL (ref 42–135)
Saturation Ratios: 22 % (ref 20–55)
TIBC: 268 ug/dL (ref 250–470)

## 2012-11-17 MED ORDER — LEVOFLOXACIN IN D5W 500 MG/100ML IV SOLN
INTRAVENOUS | Status: AC
  Filled 2012-11-17: qty 100

## 2012-11-17 MED ORDER — LEVOFLOXACIN IN D5W 250 MG/50ML IV SOLN
250.0000 mg | INTRAVENOUS | Status: DC
  Administered 2012-11-17 – 2012-11-18 (×2): 250 mg via INTRAVENOUS
  Filled 2012-11-17 (×3): qty 50

## 2012-11-17 NOTE — Progress Notes (Signed)
Patient started on levaquin 500mg  IV q24h for UTI.   Manufacturer recommendations for estimated CrCl 20-54ml/min is 500mg  IV x1 dose then 250mg  IV q24h.   Patient's renal function is at his baseline & estimated CrCl ~ 59ml/min.  Levaquin dose adjusted per renal function.   Junita Push, PharmD, BCPS

## 2012-11-17 NOTE — Evaluation (Signed)
Clinical/Bedside Swallow Evaluation Patient Details  Name: [name] [name] MRN: [id number] Date of Birth: [birth year]  Today's Date: 11/17/2012 Time: 4098-1191 SLP Time Calculation (min): 10 min  Past Medical History:  Past Medical History  Diagnosis Date  . Dementia   . Falls frequently   . CAD (coronary artery disease)   . Hypothyroidism   . Meniere disease   . Hyperlipidemia   . Osteoporosis   . Back pain   . PONV (postoperative nausea and vomiting)   . COPD (chronic obstructive pulmonary disease)   . Anemia    Past Surgical History:  Past Surgical History  Procedure Laterality Date  . Colonoscopy  Oct 2008    Dr. Darrick Penna: pancolonic diverticulosis, tubular adenomas, due for surveillance in 5 years  . Esophagogastroduodenoscopy N/A 10/04/2012    Procedure: ESOPHAGOGASTRODUODENOSCOPY (EGD);  Surgeon: Corbin Ade, MD;  Location: [facility];  Service: Endoscopy;  Laterality: N/A;   HPI:  [name] [name] is a 79 y.o. female with a past medical history of dementia, osteoporosis, frequent falls, hypothyroidism, who lives in an assisted living facility. She has been admitted to hospital several times recently, and is here today due to shortness of breath and altered mental status. Previous family report of pt's cognition at baseline. Patient has dementia and presents with garbled speech.   Assessment / Plan / Recommendation Clinical Impression  Pt presents with generalized weakness throughout oropharyngeal musculature as well as increased confusion, which both inhibit her ability to safely swallow. Pt was unable to speak clearly, though she did present garbled speech following each question asked. Pt was able to shake head yes in response to offer of food. During bedside evaluation, pt was presented with ice chips, thin liquid, and puree, all in which pt was able to tolerate without overt s/sx of aspiration and/or penetration. Swallow initiation was timely, and swallow was audible with  moderate laryngeal elevation. Presentations were via spoon with several consecutive cup sips of thin liquids. Recommend puree diet with thin liquids until pt increases mental status for increased safety. Recommendations communicated with nursing.     Aspiration Risk  Mild    Diet Recommendation Dysphagia 1 (Puree);Thin liquid   Liquid Administration via: Cup Medication Administration: Crushed with puree Supervision: Staff feed patient Compensations: Slow rate;Small sips/bites;Check for pocketing Postural Changes and/or Swallow Maneuvers: Seated upright 90 degrees;Upright 30-60 min after meal    Other  Recommendations Oral Care Recommendations: Oral care BID      Frequency and Duration min 2x/week  1 week       SLP Swallow Goals Patient will consume recommended diet without observed clinical signs of aspiration with: Modified independent assistance   Swallow Study Prior Functional Status       General Date of Onset: 11/17/12 HPI: [name] [name] is a 79 y.o. female with a past medical history of dementia, osteoporosis, frequent falls, hypothyroidism, who lives in an assisted living facility. She has been admitted to hospital several times recently, and is here today due to shortness of breath and altered mental status. Previous family report of pt's cognition at baseline. Patient has dementia and presents with garbled speech. Type of Study: Bedside swallow evaluation Diet Prior to this Study: Information not available Respiratory Status: Supplemental O2 delivered via (comment) Behavior/Cognition: Cooperative;Confused Oral Cavity - Dentition: Missing dentition Self-Feeding Abilities: Needs assist;Needs set up Patient Positioning: Upright in bed Baseline Vocal Quality: Hoarse Volitional Cough: Weak Volitional Swallow: Able to elicit    Oral/Motor/Sensory Function Overall Oral Motor/Sensory Function: Impaired  at baseline Labial Strength: Reduced Lingual Strength: Reduced Facial  Strength: Reduced Velum: Within Functional Limits Mandible: Impaired   Ice Chips Ice chips: Within functional limits Presentation: Spoon   Thin Liquid Thin Liquid: Within functional limits Presentation: Spoon;Cup    Nectar Thick Nectar Thick Liquid: Not tested   Honey Thick Honey Thick Liquid: Not tested   Puree Puree: Within functional limits Presentation: Spoon   Solid   GO    Solid: Not tested       [name] [name] S 11/17/2012,8:53 AM

## 2012-11-17 NOTE — Evaluation (Signed)
Physical Therapy Evaluation Patient Details Name: [name] [name] MRN: [id number] DOB: 07/17/1925 Today's Date: 11/17/2012 Time: 7829-5621 PT Time Calculation (min): 24 min  PT Assessment / Plan / Recommendation Clinical Impression  Pt was seen for evaluation.  She has severe cognitive impairment and lives on memory care unit of [facility].  She is alert and doesn't resist tx but is not able to participate either.  She is unable to follow any directions.  She requires max assist x2 to transfer to sitting and max assist x1 to transfer bed to chair.  Because of her dementia I would not recommend SNF at d/c.  As long as [facility] is agreeable to providing total care, this would be the best discharge plan.    PT Assessment  Patent does not need any further PT services    Follow Up Recommendations  No PT follow up    Does the patient have the potential to tolerate intense rehabilitation      Barriers to Discharge        Equipment Recommendations  None recommended by PT    Recommendations for Other Services     Frequency      Precautions / Restrictions Precautions Precautions: Fall Restrictions Weight Bearing Restrictions: No Other Position/Activity Restrictions: WBAT R   Pertinent Vitals/Pain       Mobility  Bed Mobility Bed Mobility: Supine to Sit Supine to Sit: 1: +2 Total assist Supine to Sit: Patient Percentage: 0% Transfers Transfers: Stand Pivot Transfers Stand Pivot Transfers: 1: +1 Total assist Details for Transfer Assistance: pt is able to bear weight on LEs to a small degree and doesn't resist transfer Ambulation/Gait Ambulation/Gait Assistance: Not tested (comment)    Exercises     PT Diagnosis:    PT Problem List:   PT Treatment Interventions:     PT Goals    Visit Information  Last PT Received On: 11/17/12 Assistance Needed: +2 PT/OT Co-Evaluation/Treatment: Yes    Subjective Data  Subjective: daughter states that pt has been living a [facility] and has been  needing total care since recent pelvic fx.  Previously, she was able to assist with transfer bed to w/c, but has not been ambulatory since Easter.  Staff at [facility] has been able and willing to provide extended care for pt. Patient Stated Goal: none stated   Prior Functioning  Home Living Available Help at Discharge: Personal care attendant Type of Home: Assisted living Home Layout: One level Home Adaptive Equipment: Wheelchair - manual Additional Comments: pt had been able to paddle w/c with feet prior to recent pelvic fx Prior Function Level of Independence: Needs assistance Needs Assistance: Bathing;Dressing;Grooming;Toileting;Meal Prep;Light Housekeeping;Transfers Bath: Total Dressing: Total Feeding: Supervision/set-up Grooming: Total Toileting: Total Meal Prep: Total Light Housekeeping: Total Transfer Assistance: max assist x2 since recent pelvic fx Able to Take Stairs?: No Driving: No Vocation: Retired Comments: pt is on memory care unit of [facility] Communication Communication:  (minimally communicative)    Cognition  Cognition Arousal/Alertness: Awake/alert Behavior During Therapy: WFL for tasks assessed/performed Overall Cognitive Status: History of cognitive impairments - at baseline    Extremity/Trunk Assessment Right Lower Extremity Assessment RLE ROM/Strength/Tone: Unable to fully assess;Due to pain;Due to impaired cognition Left Lower Extremity Assessment LLE ROM/Strength/Tone: WFL for tasks assessed;Due to pain;Due to impaired cognition   Balance Balance Balance Assessed: Yes Static Sitting Balance Static Sitting - Balance Support: No upper extremity supported Static Sitting - Level of Assistance: 5: Stand by assistance  End of Session PT - End of  Session Equipment Utilized During Treatment: Gait belt;Oxygen Activity Tolerance: Patient tolerated treatment well Patient left: in chair;with call bell/phone within reach;with chair alarm set Nurse  Communication: Mobility status  GP     Konrad Penta 11/17/2012, 10:07 AM

## 2012-11-17 NOTE — Progress Notes (Signed)
[id number] 

## 2012-11-17 NOTE — Progress Notes (Signed)
NAMEJASHA, [name] NO.:  [id number]  MEDICAL RECORD NO.:  [id number]  LOCATION:  A332                          FACILITY:  [facility]  PHYSICIAN:  Melvyn Novas, MDDATE OF BIRTH:  Apr 22, 1926  DATE OF PROCEDURE: DATE OF DISCHARGE:                                PROGRESS NOTE   SUBJECTIVE:  An 79 year old white female with progressive dementia with 4 previous falls in the preceding months living in assisted living. Last fall was 4 nights ago.  X-ray and CT scan of pelvis reveals an acute pelvic fracture with minimal displacement.  She was sent home for activity and physical therapy, and came back because of some "gurgling" in her lungs.  She was seen and placed on IV Levaquin empirically and found to have mild volume overload per radiograph of her chest with minimally elevated white count of 10.6.  We will be treating volume overload as well as any bronchitic infection.  She is significantly demented at this point, and perhaps suffering from superimposed delirium.  She likewise has known hypothyroidism, hyperlipidemia, osteoporosis, chronic compression fractures, and COPD.  Colonoscopy revealed pancolonic diverticulosis in October 2008.  EGD revealed no significant abnormalities in 2014.  OBJECTIVE:  VITAL SIGNS:  Blood pressure currently is 147/88, respiratory rate is 18, pulse is 80, O2 sat is 98%.  LUNGS:  Diminished breath sounds at the bases.  Prolonged inspiratory and expiratory phase. No rales.  No wheeze audible. HEART:  Regular rhythm.  No S3 or S4.  No heaves, thrills, or rubs. ABDOMEN:  Soft, nontender.  Bowel sounds normoactive.  No guarding, rebound, mass, or megaly.  Currently, on Lovenox.  Hemoglobin 8.5, creatinine is 0.66.  Chest x-ray reveals slightly increased pulmonary vascular congestion.  We will look for ventricular function by echo.  PLAN: 1. Right now is to get an orthopedic consult to see what options are     available for  physiotherapy when the surgical possibilities. 2. Continue anticoagulation.  She is not a candidate for Coumadin due     to frequent falls.  We will consider Xarelto after visit by     Orthopedics. 3. Control blood pressure. 4. Monitor renal function. 5. The patient is a no code.  Discussed this with the family several     times and again today.     Melvyn Novas, MD     RMD/MEDQ  D:  11/17/2012  T:  11/17/2012  Job:  [id number]

## 2012-11-17 NOTE — Progress Notes (Signed)
*  PRELIMINARY RESULTS* Echocardiogram 2D Echocardiogram has been performed.  Conrad Bowers 11/17/2012, 1:54 PM

## 2012-11-17 NOTE — Evaluation (Signed)
Occupational Therapy Evaluation Patient Details Name: Rachelanne Whidby MRN: [id number] DOB: 18-Nov-1925 Today's Date: 11/17/2012 Time: 1191-4782 OT Time Calculation (min): 26 min  OT Assessment / Plan / Recommendation Clinical Impression  Patient is a 79 y/o female s/p acute encephalopathy presenting to acute OT baseline. Patient is presently receiving Total Assist for all BADL. Patient uses w/c primarily. Patient has a recent pelvic fx (sustained this past Sunday) and staff at the [facility]F Memory [facility] have been assisting her more than usual. Recommend patient return to ALF as staff there are able to provide appropriate assistance.     OT Assessment  Patient does not need any further OT services    Follow Up Recommendations  No OT follow up       Equipment Recommendations  None recommended by OT          Precautions / Restrictions Precautions Precautions: Fall Restrictions Weight Bearing Restrictions: No Other Position/Activity Restrictions: WBAT R   Pertinent Vitals/Pain FACES: 4 right pelvic pain with movement. grimancing.   ADL  Toilet Transfer: Performed;+1 Total assistance Toilet Transfer Method: Stand pivot Toilet Transfer Equipment:  (to recliner)      Visit Information  Last OT Received On: 11/17/12 Assistance Needed: +2 PT/OT Co-Evaluation/Treatment: Yes    Subjective Data  Subjective: Nonverbal/noncoherent Patient Stated Goal: None stated.   Prior Functioning     Home Living Available Help at Discharge: Personal care attendant Type of Home: Assisted living (memory care) Home Layout: One level Home Adaptive Equipment: Wheelchair - manual Additional Comments: pt had been able to paddle w/c with feet prior to recent pelvic fx Prior Function Level of Independence: Needs assistance Needs Assistance: Bathing;Dressing;Grooming;Toileting;Meal Prep;Light Housekeeping;Transfers Bath: Total Dressing: Total Feeding: Supervision/set-up Grooming: Total Toileting:  Total Meal Prep: Total Light Housekeeping: Total Transfer Assistance: max assist x2 since recent pelvic fx Able to Take Stairs?: No Driving: No Vocation: Retired Comments: pt is on memory care unit of [facility] Communication Communication: Other (comment) (Dementia. Pt talks but is noncoherent.)         Vision/Perception Vision - History Baseline Vision:  (unknown)   Cognition  Cognition Arousal/Alertness: Awake/alert Behavior During Therapy: WFL for tasks assessed/performed Overall Cognitive Status: History of cognitive impairments - at baseline    Extremity/Trunk Assessment Right Upper Extremity Assessment RUE ROM/Strength/Tone: Unable to fully assess;Due to impaired cognition Left Upper Extremity Assessment LUE ROM/Strength/Tone: Unable to fully assess;Due to impaired cognition Right Lower Extremity Assessment RLE ROM/Strength/Tone: Unable to fully assess;Due to pain;Due to impaired cognition Left Lower Extremity Assessment LLE ROM/Strength/Tone: WFL for tasks assessed;Due to pain;Due to impaired cognition     Mobility Bed Mobility Bed Mobility: Supine to Sit Supine to Sit: 1: +2 Total assist Supine to Sit: Patient Percentage: 0% Transfers Details for Transfer Assistance: pt is able to bear weight on LEs to a small degree and doesn't resist transfer        Balance Balance Balance Assessed: Yes Static Sitting Balance Static Sitting - Balance Support: No upper extremity supported Static Sitting - Level of Assistance: 5: Stand by assistance   End of Session OT - End of Session Equipment Utilized During Treatment: Gait belt Activity Tolerance: Patient tolerated treatment well Patient left: in chair;with call bell/phone within reach;with chair alarm set;with family/visitor present    Limmie Patricia, OTR/L,CBIS   11/17/2012, 11:03 AM

## 2012-11-17 NOTE — Clinical Social Work Psychosocial (Signed)
Clinical Social Work Department BRIEF PSYCHOSOCIAL ASSESSMENT 11/17/2012  Patient:  [name] [name],[name] [name]     Account Number:  [id number]     Admit date:  11/16/2012  Clinical Social Worker:  Nancie Neas  Date/Time:  11/17/2012 10:25 AM  Referred by:  Physician  Date Referred:  11/17/2012 Referred for  ALF Placement   Other Referral:   Interview type:  Family Other interview type:   daughter- [name] [name]    PSYCHOSOCIAL DATA Living Status:  FACILITY Admitted from facility:  [facility]  Level of care:  Assisted Living Primary support name:  [name] [name] Primary support relationship to patient:  CHILD, ADULT Degree of support available:   supportive    CURRENT CONCERNS Current Concerns  Post-Acute Placement   Other Concerns:    SOCIAL WORK ASSESSMENT / PLAN CSW met with pt's daughter at bedside. Pt alert and sitting in chair after working with PT, but has dementia. Admitted with UTI and pneumonia. Pt known to CSW from previous admissions. She has been a resident at [facility] for about 2 years on the memory care unit. Family is very involved and supportive. Report from daughter and facility that pt has been declining since hospitalization in April, both cognitively and physically. She fell earlier this week and has a fractured pelvis. Pt has been primarily using a wheelchair and requires max assist with all ADLs. She can sometimes feed herself. Pt is incontinent. Evaluated by speech therapy today and recommendation is for pureed diet with thin liquids. Facility notified of speech recommendations and PT evaluation. Per Marchelle Folks at [facility], okay for return at d/c. Pt was receiving in house home health PT and RN.   Assessment/plan status:  Psychosocial Support/Ongoing Assessment of Needs Other assessment/ plan:   Information/referral to community resources:   [employer]    PATIENT'S/FAMILY'S RESPONSE TO PLAN OF CARE: Pt unable to participate in plan of care due to  dementia. [name], [name] [name] reports very positive feelings regarding pt's return to [facility] when medically stable. CSW to continue to follow and update facility as needed.       Derenda Fennel, [place] [phone]

## 2012-11-18 DIAGNOSIS — S329XXA Fracture of unspecified parts of lumbosacral spine and pelvis, initial encounter for closed fracture: Secondary | ICD-10-CM

## 2012-11-18 LAB — BASIC METABOLIC PANEL
BUN: 20 mg/dL (ref 6–23)
CO2: 30 mEq/L (ref 19–32)
Calcium: 9 mg/dL (ref 8.4–10.5)
Glucose, Bld: 103 mg/dL — ABNORMAL HIGH (ref 70–99)
Sodium: 139 mEq/L (ref 135–145)

## 2012-11-18 LAB — URINE CULTURE

## 2012-11-18 NOTE — Progress Notes (Signed)
[id number] 

## 2012-11-18 NOTE — Progress Notes (Signed)
NAMEKRISTELL, WOODING               ACCOUNT NO.:  [id number]  MEDICAL RECORD NO.:  [id number]  LOCATION:  A332                          FACILITY:  [facility]  PHYSICIAN:  Melvyn Novas, MDDATE OF BIRTH:  08-27-1925  DATE OF PROCEDURE: DATE OF DISCHARGE:                                PROGRESS NOTE   SUBJECTIVE:  The patient has some mildly displaced fractured pubic rami, likewise has concomitant moderate-to-severe dementia, progressive falls over the preceding 4-6 weeks, chronic anemia with normal iron indices as well as a B12 and folic acid.  A 2D echo yesterday revealed normal left ventricular chamber size, mild LVH with normal systolic function.  She has mildly diminished albumin with normal serum proteins possible for mildly diminished plasma oncotic pressure.  OBJECTIVE:  VITAL SIGNS:  Blood pressure today is 135/72, temperature 98.7, pulse is 87 and regular, respiratory rate is 20.  LUNGS: Diminished breath sounds at the bases.  No rales, wheeze, or rhonchi appreciable.  HEART:  Regular rhythm.  No S3, S4.  No heaves, thrills, or rubs.  ABDOMEN:  Soft, nontender.  Bowel sounds normoactive.  No guarding or rebound.  PLAN:  Still awaiting consultation by Orthopedic Surgery regarding pelvic fracture.  Will continue on Lovenox.  Watch BMET daily.  Continue thyroid supplementation for chronic hypothyroidism and will make further recommendations as the database expands.     Melvyn Novas, MD     RMD/MEDQ  D:  11/18/2012  T:  11/18/2012  Job:  [id number]

## 2012-11-18 NOTE — Consult Note (Signed)
Reason for Consult: Fracture right superior pubic ramus pelvis Referring Physician: Dr. Delbert Harness  [name] [name] is an 79 y.o. female.  HPI: Chief Complaint: Shortness of breath, and altered mental status  HPI: [name] [name] is a 79 y.o. female with a past medical history of dementia, osteoporosis, frequent falls, hypothyroidism, who lives in an assisted living facility. She gets around in a wheelchair. She was seen few days ago in the ED, after she sustained a fall and was found to have a pelvic fracture. She was prescribed Ultram and was discharged home. Patient has has declined since then. Today she was found to be short of breath. She was confused. She was subsequently brought into the hospital. She was found to be wheezing and then started having a rattling sound from her chest. She was admitted back in May for severe anemia and was found to have gastric ulcer. H. pylori was positive and she was given treatment for the same. Her daughter is at the, bedside, and she tells me that her mental status is close to baseline at this time. Patient has dementia. She doesn't respond at this time, and is unable to provide any history.   Past Medical History  Diagnosis Date  . Dementia   . Falls frequently   . CAD (coronary artery disease)   . Hypothyroidism   . Meniere disease   . Hyperlipidemia   . Osteoporosis   . Back pain   . PONV (postoperative nausea and vomiting)   . COPD (chronic obstructive pulmonary disease)   . Anemia     Past Surgical History  Procedure Laterality Date  . Colonoscopy  Oct 2008    Dr. Darrick Penna: pancolonic diverticulosis, tubular adenomas, due for surveillance in 5 years  . Esophagogastroduodenoscopy N/A 10/04/2012    Procedure: ESOPHAGOGASTRODUODENOSCOPY (EGD);  Surgeon: Corbin Ade, MD;  Location: [facility];  Service: Endoscopy;  Laterality: N/A;    Family History  Problem Relation Age of Onset  . Stroke Mother   . Diabetes Mother   . Heart disease Father    . Heart disease Sister   . Diabetes Sister     Social History:  reports that she quit smoking about 17 months ago. Her smoking use included Cigarettes. She has a 60 pack-year smoking history. She has never used smokeless tobacco. She reports that she does not drink alcohol or use illicit drugs.  Allergies:  Allergies  Allergen Reactions  . Codeine   . Penicillins   . Sulfonamide Derivatives     Medications: I have reviewed the patient's current medications.   Ct Head Wo Contrast  11/16/2012   *RADIOLOGY REPORT*  Clinical Data: Shortness of breath.  History of fall.  CT HEAD WITHOUT CONTRAST  Technique:  Contiguous axial images were obtained from the base of the skull through the vertex without contrast.  Comparison: And CT 11/12/2012.  Findings: Mild cerebral atrophy.  Patchy and confluent areas of decreased attenuation throughout the deep and periventricular white matter of the cerebral hemispheres bilaterally, compatible with chronic microvascular ischemic disease. No acute displaced skull fractures are identified.  No acute intracranial abnormality. Specifically, no evidence of acute post-traumatic intracranial hemorrhage, no definite regions of acute/subacute cerebral ischemia, no focal mass, mass effect, hydrocephalus or abnormal intra or extra-axial fluid collections.  The visualized paranasal sinuses and mastoids are well pneumatized.  IMPRESSION: 1.  No acute intracranial abnormalities. 2.  Mild cerebral atrophy and chronic microvascular ischemic changes in the cerebral white matter redemonstrated, as above.  Original Report Authenticated By: Trudie Reed, M.D.   Dg Chest Portable 1 View  11/16/2012   *RADIOLOGY REPORT*  Clinical Data: Short of breath  PORTABLE CHEST - 1 VIEW  Comparison: Prior chest x-ray 10/03/2012  Findings: Slightly increased pulmonary vascular congestion bordering on interstitial edema.  Mild bibasilar atelectasis. Stable cardiomegaly.  Atherosclerotic  calcifications in the transverse aorta.  No acute osseous abnormality.  IMPRESSION: Slightly increased pulmonary vascular congestion now bordering on mild interstitial edema suggests mild CHF.   Original Report Authenticated By: Malachy Moan, M.D.    ROS the patient is noncommunicative today she is very somnolent I cannot do a review of systems Blood pressure 135/72, pulse 87, temperature 98.7 F (37.1 C), temperature source Axillary, resp. rate 20, height 5\' 2"  (1.575 m), weight 119 lb 9.6 oz (54.25 kg), SpO2 94.00%. Physical Exam  Vital signs are stable as recorded  General appearance is normal, body habitus small frame ectomorphic body habitus  The patient is alert and oriented x 3  The patient's mood and affect are normal  Gait assessment: Lying in bed no ambulation hasn't walked very well since her last osteoporotic vertebrae fracture  The cardiovascular exam reveals normal pulses and temperature without edema or  swelling.  The lymphatic system is negative for palpable lymph nodes  The sensory exam is normal.  There are no pathologic reflexes. Balance was not assessable  Exam of the left lower extremity revealed only an abrasion over the lower leg  Limb alignment was normal muscle tone was normal joints were all reduced Inspection right leg I was able to flex and extend the knee and the hip and rotated without her complaining of much pain. All joints are reduced and stable. Muscle tone was normal. Skin no rash.  Assessment/Plan: Right superior pubic ramus fracture stable  Mobilize as tolerated weightbearing as tolerated x-rays in 6 weeks  Fuller Canada 11/18/2012, 10:21 AM

## 2012-11-18 NOTE — Plan of Care (Signed)
Problem: Phase II Progression Outcomes Goal: Progress activity as tolerated unless otherwise ordered Outcome: Progressing patient does get OOB to chair.

## 2012-11-19 LAB — BASIC METABOLIC PANEL
BUN: 15 mg/dL (ref 6–23)
CO2: 26 mEq/L (ref 19–32)
Calcium: 9 mg/dL (ref 8.4–10.5)
Glucose, Bld: 102 mg/dL — ABNORMAL HIGH (ref 70–99)
Sodium: 137 mEq/L (ref 135–145)

## 2012-11-19 MED ORDER — DEXTROSE 5 % IV SOLN
1.0000 g | INTRAVENOUS | Status: DC
  Administered 2012-11-19 – 2012-11-20 (×2): 1 g via INTRAVENOUS
  Filled 2012-11-19 (×2): qty 10

## 2012-11-19 NOTE — Progress Notes (Signed)
[id number] 

## 2012-11-19 NOTE — Progress Notes (Signed)
NAMENAJMO, PARDUE               ACCOUNT NO.:  [id number]  MEDICAL RECORD NO.:  [id number]  LOCATION:  A332                          FACILITY:  [facility]  PHYSICIAN:  Melvyn Novas, MDDATE OF BIRTH:  25-Nov-1925  DATE OF PROCEDURE:  11/19/2012 DATE OF DISCHARGE:                                PROGRESS NOTE   An 79 year old severely demented white female status post mildly displaced fracture, pubic rami superior, some chronic anemia, normal iron, B12 and folic acid indices, normal 2D echo with systolic function, hypothyroidism being repleted, history of hypertension, controlled, seen by Orthopedics, recommend ambulation with physical therapy and conservative measures given total status.  Creatinine 0.63, potassium 3.8, hemoglobin 8.5.  PHYSICAL EXAMINATION:  VITAL SIGNS:  Blood pressure 157/73, temperature is 97.4, pulse 71 and regular, respiratory rate is 20. LUNGS:  No rales, wheeze, or rhonchi appreciable. HEART:  Regular rhythm.  No S3, S4.  No heaves, thrills, or rubs. ABDOMEN:  Soft, nontender.  I do not feel the patient is a candidate for Coumadin due to recurrent of frequent falls.  We will consider some other form of DVT prophylaxis assessing risk benefit ratio at present given her minimally ambulatory status and pelvic fracture.     Melvyn Novas, MD     RMD/MEDQ  D:  11/19/2012  T:  11/19/2012  Job:  [id number]

## 2012-11-20 LAB — BASIC METABOLIC PANEL
BUN: 16 mg/dL (ref 6–23)
Chloride: 105 mEq/L (ref 96–112)
Glucose, Bld: 96 mg/dL (ref 70–99)
Potassium: 4.2 mEq/L (ref 3.5–5.1)

## 2012-11-20 MED ORDER — ALBUTEROL SULFATE (5 MG/ML) 0.5% IN NEBU
2.5000 mg | INHALATION_SOLUTION | Freq: Three times a day (TID) | RESPIRATORY_TRACT | Status: DC
  Administered 2012-11-20: 2.5 mg via RESPIRATORY_TRACT
  Filled 2012-11-20: qty 0.5

## 2012-11-20 MED ORDER — ENSURE COMPLETE PO LIQD: 237.0000 mL | Freq: Two times a day (BID) | ORAL | Status: DC

## 2012-11-20 NOTE — Progress Notes (Signed)
INITIAL NUTRITION ASSESSMENT   INTERVENTION: Ensure Complete po BID, each supplement provides 350 kcal and 13 grams of protein.  NUTRITION DIAGNOSIS: Malnutrition; ongoing.   Goal: Pt to meet >/= 90% of their estimated nutrition needs  Monitor:  Po intake, labs and wt trends  Reason for Assessment: Braden=12  79 y.o. female  Admitting Dx: Acute encephalopathy  ASSESSMENT: Pt unable to provide hx and no family members are present. Hx includes dementia, falls, poor po intake, severe anemia. Presented to ED following fall and has a pelvic fracture.  Wt hx show additional wt loss of (6#,5%) since assessed by RD on 10/03/12.   At that time pt met criteria for moderate malnutrition in the context of acute illness due to severe wt loss (7.4% x 30 days and decreased oral intake <75% energy intake x 1 week).   Braden score=12 which places her at high risk for skin breakdown.  Height: Ht Readings from Last 1 Encounters:  11/17/12 5\' 2"  (1.575 m)    Weight: Wt Readings from Last 1 Encounters:  11/17/12 119 lb 9.6 oz (54.25 kg)    Ideal Body Weight: 110# (50 kg)  % Ideal Body Weight: 109%  Wt Readings from Last 10 Encounters:  11/17/12 119 lb 9.6 oz (54.25 kg)  11/12/12 125 lb (56.7 kg)  10/03/12 125 lb 10.6 oz (57 kg)  10/03/12 125 lb 10.6 oz (57 kg)  09/19/12 135 lb (61.236 kg)  09/01/12 135 lb (61.236 kg)  03/29/12 130 lb (58.968 kg)  11/03/11 150 lb (68.04 kg)    Usual Body Weight: 125.10#  (10/03/12  % Usual Body Weight: 95%  BMI:  Body mass index is 21.87 kg/(m^2).normal  Estimated Nutritional Needs: Kcal: 1350-1620 Protein: 60-70 gr Fluid: 1600 ml (normal needs)  Skin: skin tears leg, elbow  Diet Order: [id number] with thin liquids  EDUCATION NEEDS: -Education not appropriate at this time   Intake/Output Summary (Last 24 hours) at 11/20/12 1056 Last data filed at 11/20/12 0641  Gross per 24 hour  Intake      0 ml  Output    350 ml  Net   -350 ml     Last BM: 11/19/12  Labs:   Recent Labs Lab 11/18/12 0645 11/19/12 0650 11/20/12 0543  NA 139 137 142  K 3.4* 3.8 4.2  CL 104 103 105  CO2 30 26 27   BUN 20 15 16   CREATININE 0.65 0.63 0.74  CALCIUM 9.0 9.0 9.5  GLUCOSE 103* 102* 96    CBG (last 3)  No results found for this basename: GLUCAP,  in the last 72 hours  Scheduled Meds: . albuterol  2.5 mg Nebulization TID  . cefTRIAXone (ROCEPHIN)  IV  1 g Intravenous Q24H  . enoxaparin (LOVENOX) injection  40 mg Subcutaneous Q24H    Continuous Infusions:   Past Medical History  Diagnosis Date  . Dementia   . Falls frequently   . CAD (coronary artery disease)   . Hypothyroidism   . Meniere disease   . Hyperlipidemia   . Osteoporosis   . Back pain   . PONV (postoperative nausea and vomiting)   . COPD (chronic obstructive pulmonary disease)   . Anemia     Past Surgical History  Procedure Laterality Date  . Colonoscopy  Oct 2008    Dr. Darrick Penna: pancolonic diverticulosis, tubular adenomas, due for surveillance in 5 years  . Esophagogastroduodenoscopy N/A 10/04/2012    Procedure: ESOPHAGOGASTRODUODENOSCOPY (EGD);  Surgeon: Corbin Ade, MD;  Location:  [facility];  Service: Endoscopy;  Laterality: N/A;   Royann Shivers MS,RD,LDN,CSG Office: [phone] Pager: [phone]

## 2012-11-20 NOTE — Progress Notes (Signed)
Pt discharged with packet.  Pt left the floor via stretcher with [facility] to be transferred to [facility] in stable condition.

## 2012-11-20 NOTE — Clinical Documentation Improvement (Signed)
MALNUTRITION DOCUMENTATION CLARIFICATION  THIS DOCUMENT IS NOT A PERMANENT PART OF THE MEDICAL RECORD  TO RESPOND TO THE THIS QUERY, FOLLOW THE INSTRUCTIONS BELOW:  1. If needed, update documentation for the patient's encounter via the notes activity.  2. Access this query again and click edit on the In [employer].  3. After updating, or not, click F2 to complete all highlighted (required) fields concerning your review. Select "additional documentation in the medical record" OR "no additional documentation provided".  4. Click Sign note button.  5. The deficiency will fall out of your In Basket *Please let us know if you are not able to complete this workflow by phone or e-mail (listed below).  Please update your documentation within the medical record to reflect your response to this query.                                                                                        11/20/12   Dear Dr.DonDiego / Associates,  In a better effort to capture your patient's severity of illness, reflect appropriate length of stay and utilization of resources, a review of the patient medical record has revealed the following indicators.   Based on your clinical judgment, please clarify and document in a progress note and/or discharge summary the clinical condition associated with the following supporting information: In responding to this query please exercise your independent judgment.  The fact that a query is asked, does not imply that any particular answer is desired or expected.  Please clarify nutritional status. Thank you.  Possible Clinical Conditions?  Mild Malnutrition  Moderate Malnutrition Severe Malnutrition   Protein Calorie Malnutrition Severe Protein Calorie Malnutrition Other Condition________________ Cannot clinically determine     Supporting Information: Risk Factors: Hx includes dementia, falls, poor po intake, severe anemia Wt hx show additional wt loss of (6#,5%)  since assessed by RD on 10/03/12.  At that time pt met criteria for moderate malnutrition   Signs & Symptoms: Ht 5'2"   Wt 119 lbs  BMI:  21.87    Treatment  Diet Order: [id number] with thin liquids  I&O monitoring  Nutrition Consult NUTRITION DIAGNOSIS:  Malnutrition; ongoing.  Goal:  Pt to meet >/= 90% of their estimated nutrition needs  Monitor:  Po intake, labs and wt trends INTERVENTION:  Ensure Complete po BID, each supplement provides 350 kcal and 13 grams of protein.   You may use possible, probable, or suspect with inpatient documentation. possible, probable, suspected diagnoses MUST be documented at the time of discharge  Reviewed:No response Thank You,  Harless Litten RN, MSN Clinical Documentation Specialist: Office# [phone] [facility] Health Information Management [place]

## 2012-11-20 NOTE — Progress Notes (Signed)
Per Dr. Janna Arch orders:  Please use medications listed on the Discharge Summary.

## 2012-11-20 NOTE — Discharge Summary (Signed)
[id number] 

## 2012-11-20 NOTE — Clinical Social Work Note (Signed)
Pt d/c today back to [facility]. [name] present in room and agreeable as well as facility. [employer] notified of pt requiring nectar thick liquids. D/C summary and FL2 faxed. Per MD, follow d/c summary for medications. DNR in packet. Pt to transfer via [facility].  Derenda Fennel, [place] [phone]

## 2012-11-20 NOTE — Discharge Summary (Signed)
[name] [name], KOCI NO.:  [id number]  MEDICAL RECORD NO.:  [id number]  LOCATION:  A332                          FACILITY:  [facility]  PHYSICIAN:  Melvyn Novas, MDDATE OF BIRTH:  11/16/1925  DATE OF ADMISSION:  11/16/2012 DATE OF DISCHARGE:  LH                         DISCHARGE SUMMARY-REFERRING   The patient is an 79 year old white lady with progressive dementia who has had multiple falls in the preceding month.  Has a mildly displaced superior pubic rami fracture, was admitted, had some diminished cognition, workup for delirium was essentially negative.  There was some mild evidence of UTI.  She was placed on Rocephin empirically. Basically, it was recommended by Orthopedic Surgery to have her participate in physical therapy, and conservative therapy will follow up x-ray in 6 weeks.  No therapy was recommended.  She likewise has hypertension under good control.  Anemia, which is multifactorial with a previous history of iron deficiency.  Currently, lab work is within normal limits on supplementation.  She likewise has hypothyroidism with normal TSH and also had hyperlipidemia, on Pravachol.  She was placed in the hospital, placed on Lovenox.  Had no significant problems.  Her hemoglobin was stable at 8.5.  No evidence of bleeding.  She tolerated the diet well and was elected after a long discussion with orthopedics not to place her on anticoagulation due to her frequent falls and significant dementia.  DISCHARGE MEDICATIONS: 1. Amlodipine 5 mg p.o. daily. 2. Niferex 150 mg p.o. daily. 3. Lactulose syrup 10 g t.i.d. p.o. 4. Synthroid 75 mcg p.o. daily. 5. Namenda 10 mg p.o. b.i.d. 6. Pravachol 40 mg p.o. daily. 7. Protonix 40 mg p.o. daily. 8. Aspirin 81 mg p.o. daily.  She will participate in physical therapy at [facility], as well as follow up x-rays of her pelvis.     Melvyn Novas, MD     RMD/MEDQ  D:  11/20/2012  T:   11/20/2012  Job:  [id number]

## 2012-11-21 LAB — CULTURE, BLOOD (ROUTINE X 2): Culture: NO GROWTH

## 2012-12-18 ENCOUNTER — Encounter: Payer: Self-pay | Admitting: Internal Medicine

## 2012-12-19 ENCOUNTER — Encounter: Payer: Self-pay | Admitting: Gastroenterology

## 2012-12-19 ENCOUNTER — Ambulatory Visit (INDEPENDENT_AMBULATORY_CARE_PROVIDER_SITE_OTHER): Payer: Medicare Other | Admitting: Gastroenterology

## 2012-12-19 VITALS — BP 135/76 | HR 72 | Temp 97.9°F | Ht 62.0 in

## 2012-12-19 DIAGNOSIS — D649 Anemia, unspecified: Secondary | ICD-10-CM

## 2012-12-19 DIAGNOSIS — K279 Peptic ulcer, site unspecified, unspecified as acute or chronic, without hemorrhage or perforation: Secondary | ICD-10-CM

## 2012-12-19 NOTE — Patient Instructions (Addendum)
We have scheduled you for a follow up upper endoscopy to verify ulcer healing. Please see separate instructions.

## 2012-12-19 NOTE — Progress Notes (Signed)
Primary Care Physician:  [name], MD  Primary Gastroenterologist:  Jonette Eva, MD   Chief Complaint  Patient presents with  . EGD    HPI:  [name] [name] is a 79 y.o. female here for followup visit. She was seen in the hospital back in May 2014 with significant anemia with hemoglobin of 5.6. Heme positive on exam. Last colonoscopy in 2008 with tubular adenomas, pancolonic diverticulosis. She had an upper endoscopy by Dr. Jena Gauss (primary GI Dr. Darrick Penna). She had a huge gastric ulcer involving a large part of the body of the stomach with opening to a patent small bowel limb for several centimeters. There was no CT evidence of prior GI surgery and the daughter cannot recall her mother having one. Query small bowel fistulization with a large gastric ulcer versus unknown GI surgery many years ago. Biopsies were positive for H. pylori. She was treated with Pylera. Patient also had history of NSAID use at the time.   Recent pelvic fracture, June 2014. Her last hemoglobin was 8.5 last month.  Daughter states there's been no report of melena, rectal bleeding. Patient has been eating well. No vomiting or complaints of abdominal pain. She states her mother has been "more herself".   Current Outpatient Prescriptions  Medication Sig Dispense Refill  . albuterol (PROVENTIL) (2.5 MG/3ML) 0.083% nebulizer solution Take 2.5 mg by nebulization every 6 (six) hours as needed for wheezing.      Marland Kitchen amLODipine (NORVASC) 5 MG tablet Take 5 mg by mouth daily.      Marland Kitchen aspirin 81 MG tablet Take 81 mg by mouth daily.      . Cranberry 1000 MG CAPS Take 1,000 mg by mouth daily.      Marland Kitchen donepezil (ARICEPT) 10 MG tablet Take 10 mg by mouth at bedtime.      . iron polysaccharides (NIFEREX) 150 MG capsule Take 1 capsule (150 mg total) by mouth daily.  30 capsule  3  . lactulose (CHRONULAC) 10 GM/15ML solution Take 10 g by mouth 3 (three) times daily.       Marland Kitchen levothyroxine (SYNTHROID, LEVOTHROID) 75 MCG tablet Take 75  mcg by mouth daily.        . pantoprazole (PROTONIX) 40 MG tablet Take 1 tablet (40 mg total) by mouth 2 (two) times daily before a meal.      . pravastatin (PRAVACHOL) 40 MG tablet Take 40 mg by mouth at bedtime.       . thiamine (VITAMIN B-1) 100 MG tablet Take 100 mg by mouth daily.      . traMADol (ULTRAM) 50 MG tablet Take 50 mg by mouth every 6 (six) hours as needed for pain.       No current facility-administered medications for this visit.    Allergies as of 12/19/2012 - Review Complete 12/19/2012  Allergen Reaction Noted  . Codeine    . Penicillins    . Sulfonamide derivatives      Past Medical History  Diagnosis Date  . Dementia   . Falls frequently   . CAD (coronary artery disease)   . Hypothyroidism   . Meniere disease   . Hyperlipidemia   . Osteoporosis   . Back pain   . PONV (postoperative nausea and vomiting)   . COPD (chronic obstructive pulmonary disease)   . Anemia     Past Surgical History  Procedure Laterality Date  . Colonoscopy  Oct 2008    Dr. Darrick Penna: pancolonic diverticulosis, tubular adenomas, due for surveillance in  5 years  . Esophagogastroduodenoscopy N/A 10/04/2012    ZOX:WRUE gastric ulcer involving a large part of the body of the stomach with opening to a patent small bowel limb.?unknown GI surgery remotely vs fistula. H.Pylori+. NSAID use.    Family History  Problem Relation Age of Onset  . Stroke Mother   . Diabetes Mother   . Heart disease Father   . Heart disease Sister   . Diabetes Sister     History   Social History  . Marital Status: Widowed    Spouse Name: N/A    Number of Children: N/A  . Years of Education: N/A   Occupational History  . Not on file.   Social History Main Topics  . Smoking status: Former Smoker -- 1.00 packs/day for 60 years    Types: Cigarettes    Quit date: 05/31/2011  . Smokeless tobacco: Never Used  . Alcohol Use: No  . Drug Use: No  . Sexually Active: No   Other Topics Concern  . Not on  file   Social History Narrative  . No narrative on file      ROS: unobtainable from patient  General: Negative for anorexia, weight loss, fever, chills, fatigue, weakness. Eyes: Negative for vision changes.  ENT: Negative for hoarseness, difficulty swallowing , nasal congestion. CV: Negative for chest pain, angina, palpitations, dyspnea on exertion, peripheral edema.  Respiratory: Negative for dyspnea at rest, dyspnea on exertion, cough, sputum, wheezing.  GI: See history of present illness. GU:  Negative for dysuria, hematuria, urinary incontinence, urinary frequency, nocturnal urination.  MS: Negative for joint pain, low back pain.  Derm: Negative for rash or itching.  Neuro: Negative for weakness, abnormal sensation, seizure, frequent headaches, memory loss, confusion.  Psych: Negative for anxiety, depression, suicidal ideation, hallucinations.  Endo: Negative for unusual weight change.  Heme: Negative for bruising or bleeding. Allergy: Negative for rash or hives.    Physical Examination:  BP 135/76  Pulse 72  Temp(Src) 97.9 F (36.6 C) (Oral)  Ht 5\' 2"  (1.575 m)   General: Well-nourished, well-developed in no acute distress. Accompanied by daughter Head: Normocephalic, atraumatic.   Eyes: Conjunctiva pink, no icterus. Mouth: Oropharyngeal mucosa moist and pink , no lesions erythema or exudate. Neck: Supple without thyromegaly, masses, or lymphadenopathy.  Lungs: Clear to auscultation bilaterally.  Heart: Regular rate and rhythm, no murmurs rubs or gallops.  Abdomen: Bowel sounds are normal, nontender, nondistended, no hepatosplenomegaly or masses, no abdominal bruits or    hernia , no rebound or guarding.   Rectal: not performed Extremities: No lower extremity edema. No clubbing or deformities.  Neuro: Alert and oriented to person.   Skin: Warm and dry, no rash or jaundice.   Psych: Alert and cooperative, normal mood and affect.  Labs: Lab Results  Component Value  Date   WBC 10.6* 11/17/2012   HGB 8.5* 11/17/2012   HCT 27.1* 11/17/2012   MCV 84.2 11/17/2012   PLT 169 11/17/2012   Lab Results  Component Value Date   IRON 58 11/17/2012   TIBC 268 11/17/2012   FERRITIN 71 11/17/2012   Lab Results  Component Value Date   VITAMINB12 251 11/17/2012   Lab Results  Component Value Date   FOLATE 7.4 11/17/2012     Imaging Studies: No results found.

## 2012-12-20 ENCOUNTER — Encounter: Payer: Self-pay | Admitting: Gastroenterology

## 2012-12-20 ENCOUNTER — Encounter (HOSPITAL_COMMUNITY): Payer: Self-pay | Admitting: Pharmacy Technician

## 2012-12-20 NOTE — Progress Notes (Signed)
Cc PCP 

## 2012-12-20 NOTE — Assessment & Plan Note (Signed)
79 year old lady with history of large gastric ulcer and profound anemia back in May 2014 likely secondary to NSAIDs and H. pylori. Subsequently completed Pylera. No longer on NSAIDs. Unfortunately she fell and suffered a pelvic fracture recently. She is coming along with physical therapy and did not require any surgery. We will recheck her CBC at this time. She is due for followup EGD to verify ulcer healing.  I have discussed the risks, alternatives, benefits with regards to but not limited to the risk of reaction to medication, bleeding, infection, perforation and the patient is agreeable to proceed. Written consent to be obtained.

## 2012-12-21 ENCOUNTER — Emergency Department (HOSPITAL_COMMUNITY): Payer: Medicare Other

## 2012-12-21 ENCOUNTER — Inpatient Hospital Stay (HOSPITAL_COMMUNITY)
Admission: EM | Admit: 2012-12-21 | Discharge: 2012-12-29 | DRG: 690 | Disposition: A | Discharge status: Hospice | Payer: Medicare Other | Attending: Family Medicine | Admitting: Family Medicine

## 2012-12-21 ENCOUNTER — Encounter (HOSPITAL_COMMUNITY): Payer: Self-pay

## 2012-12-21 ENCOUNTER — Other Ambulatory Visit: Payer: Self-pay

## 2012-12-21 DIAGNOSIS — R6521 Severe sepsis with septic shock: Secondary | ICD-10-CM

## 2012-12-21 DIAGNOSIS — I959 Hypotension, unspecified: Secondary | ICD-10-CM | POA: Diagnosis present

## 2012-12-21 DIAGNOSIS — N390 Urinary tract infection, site not specified: Secondary | ICD-10-CM

## 2012-12-21 DIAGNOSIS — T68XXXA Hypothermia, initial encounter: Secondary | ICD-10-CM | POA: Diagnosis present

## 2012-12-21 DIAGNOSIS — E039 Hypothyroidism, unspecified: Secondary | ICD-10-CM | POA: Diagnosis present

## 2012-12-21 DIAGNOSIS — J449 Chronic obstructive pulmonary disease, unspecified: Secondary | ICD-10-CM | POA: Diagnosis present

## 2012-12-21 DIAGNOSIS — S32009A Unspecified fracture of unspecified lumbar vertebra, initial encounter for closed fracture: Secondary | ICD-10-CM | POA: Diagnosis present

## 2012-12-21 DIAGNOSIS — Z66 Do not resuscitate: Secondary | ICD-10-CM | POA: Diagnosis present

## 2012-12-21 DIAGNOSIS — Z515 Encounter for palliative care: Secondary | ICD-10-CM

## 2012-12-21 DIAGNOSIS — K922 Gastrointestinal hemorrhage, unspecified: Secondary | ICD-10-CM

## 2012-12-21 DIAGNOSIS — S329XXA Fracture of unspecified parts of lumbosacral spine and pelvis, initial encounter for closed fracture: Secondary | ICD-10-CM | POA: Diagnosis present

## 2012-12-21 DIAGNOSIS — D649 Anemia, unspecified: Secondary | ICD-10-CM

## 2012-12-21 DIAGNOSIS — M810 Age-related osteoporosis without current pathological fracture: Secondary | ICD-10-CM | POA: Diagnosis present

## 2012-12-21 DIAGNOSIS — I2510 Atherosclerotic heart disease of native coronary artery without angina pectoris: Secondary | ICD-10-CM | POA: Diagnosis present

## 2012-12-21 DIAGNOSIS — Z7982 Long term (current) use of aspirin: Secondary | ICD-10-CM

## 2012-12-21 DIAGNOSIS — E785 Hyperlipidemia, unspecified: Secondary | ICD-10-CM | POA: Diagnosis present

## 2012-12-21 DIAGNOSIS — X31XXXA Exposure to excessive natural cold, initial encounter: Secondary | ICD-10-CM | POA: Diagnosis present

## 2012-12-21 DIAGNOSIS — A419 Sepsis, unspecified organism: Secondary | ICD-10-CM

## 2012-12-21 DIAGNOSIS — R4182 Altered mental status, unspecified: Secondary | ICD-10-CM | POA: Diagnosis present

## 2012-12-21 DIAGNOSIS — S32000G Wedge compression fracture of unspecified lumbar vertebra, subsequent encounter for fracture with delayed healing: Secondary | ICD-10-CM

## 2012-12-21 DIAGNOSIS — F0390 Unspecified dementia without behavioral disturbance: Secondary | ICD-10-CM | POA: Diagnosis present

## 2012-12-21 DIAGNOSIS — K279 Peptic ulcer, site unspecified, unspecified as acute or chronic, without hemorrhage or perforation: Secondary | ICD-10-CM

## 2012-12-21 DIAGNOSIS — Z87891 Personal history of nicotine dependence: Secondary | ICD-10-CM

## 2012-12-21 DIAGNOSIS — J4489 Other specified chronic obstructive pulmonary disease: Secondary | ICD-10-CM | POA: Diagnosis present

## 2012-12-21 LAB — URINE MICROSCOPIC-ADD ON

## 2012-12-21 LAB — CBC WITH DIFFERENTIAL/PLATELET
Basophils Absolute: 0 10*3/uL (ref 0.0–0.1)
Basophils Relative: 0 % (ref 0–1)
Eosinophils Absolute: 0.1 10*3/uL (ref 0.0–0.7)
Eosinophils Relative: 1 % (ref 0–5)
HCT: 30.6 % — ABNORMAL LOW (ref 36.0–46.0)
Hemoglobin: 10 g/dL — ABNORMAL LOW (ref 12.0–15.0)
Lymphocytes Relative: 16 % (ref 12–46)
Lymphs Abs: 1.3 10*3/uL (ref 0.7–4.0)
MCH: 27.2 pg (ref 26.0–34.0)
MCHC: 32.7 g/dL (ref 30.0–36.0)
MCV: 83.4 fL (ref 78.0–100.0)
Monocytes Absolute: 0.3 10*3/uL (ref 0.1–1.0)
Monocytes Relative: 4 % (ref 3–12)
Neutro Abs: 6.2 10*3/uL (ref 1.7–7.7)
Neutrophils Relative %: 79 % — ABNORMAL HIGH (ref 43–77)
Platelets: 150 10*3/uL (ref 150–400)
RBC: 3.67 MIL/uL — ABNORMAL LOW (ref 3.87–5.11)
RDW: 19.6 % — ABNORMAL HIGH (ref 11.5–15.5)
WBC: 7.9 10*3/uL (ref 4.0–10.5)

## 2012-12-21 LAB — URINALYSIS, ROUTINE W REFLEX MICROSCOPIC
Bilirubin Urine: NEGATIVE
Glucose, UA: NEGATIVE mg/dL
Nitrite: POSITIVE — AB
Specific Gravity, Urine: >1.03 — ABNORMAL HIGH (ref 1.005–1.030)
Urobilinogen, UA: 0.2 mg/dL (ref 0.0–1.0)
pH: 5.5 (ref 5.0–8.0)

## 2012-12-21 LAB — TYPE AND SCREEN
ABO/RH(D): A POS
Antibody Screen: NEGATIVE

## 2012-12-21 LAB — COMPREHENSIVE METABOLIC PANEL
ALT: 18 U/L (ref 0–35)
AST: 23 U/L (ref 0–37)
Albumin: 3 g/dL — ABNORMAL LOW (ref 3.5–5.2)
Alkaline Phosphatase: 124 U/L — ABNORMAL HIGH (ref 39–117)
BUN: 20 mg/dL (ref 6–23)
CO2: 25 mEq/L (ref 19–32)
Calcium: 9 mg/dL (ref 8.4–10.5)
Chloride: 106 mEq/L (ref 96–112)
Creatinine, Ser: 0.73 mg/dL (ref 0.50–1.10)
GFR calc Af Amer: 88 mL/min — ABNORMAL LOW (ref >90)
GFR calc non Af Amer: 76 mL/min — ABNORMAL LOW (ref >90)
Glucose, Bld: 86 mg/dL (ref 70–99)
Potassium: 3.5 mEq/L (ref 3.5–5.1)
Sodium: 140 mEq/L (ref 135–145)
Total Bilirubin: 0.3 mg/dL (ref 0.3–1.2)
Total Protein: 6.8 g/dL (ref 6.0–8.3)

## 2012-12-21 LAB — LACTIC ACID, PLASMA: Lactic Acid, Venous: 1 mmol/L (ref 0.5–2.2)

## 2012-12-21 LAB — TROPONIN I: Troponin I: <0.3 ng/mL (ref <0.30)

## 2012-12-21 MED ORDER — MORPHINE SULFATE 2 MG/ML IJ SOLN
2.0000 mg | Freq: Once | INTRAMUSCULAR | Status: AC
  Administered 2012-12-21: 2 mg via INTRAVENOUS
  Filled 2012-12-21: qty 1

## 2012-12-21 MED ORDER — VANCOMYCIN HCL IN DEXTROSE 1-5 GM/200ML-% IV SOLN
1000.0000 mg | Freq: Once | INTRAVENOUS | Status: AC
  Administered 2012-12-21: 1000 mg via INTRAVENOUS
  Filled 2012-12-21: qty 200

## 2012-12-21 MED ORDER — CIPROFLOXACIN IN D5W 400 MG/200ML IV SOLN
400.0000 mg | Freq: Once | INTRAVENOUS | Status: AC
  Administered 2012-12-21: 400 mg via INTRAVENOUS
  Filled 2012-12-21: qty 200

## 2012-12-21 MED ORDER — SODIUM CHLORIDE 0.9 % IV BOLUS (SEPSIS): 1000.0000 mL | Freq: Once | INTRAVENOUS | Status: AC

## 2012-12-21 MED ORDER — SODIUM CHLORIDE 0.9 % IV SOLN
INTRAVENOUS | Status: DC
  Administered 2012-12-21 – 2012-12-26 (×5): via INTRAVENOUS
  Administered 2012-12-27: 1 mL via INTRAVENOUS
  Administered 2012-12-28 – 2012-12-29 (×3): via INTRAVENOUS

## 2012-12-21 MED ORDER — SODIUM CHLORIDE 0.9 % IV SOLN
INTRAVENOUS | Status: DC
  Administered 2012-12-21: 1 mL via INTRAVENOUS
  Administered 2012-12-22 – 2012-12-23 (×2): via INTRAVENOUS

## 2012-12-21 MED ORDER — SODIUM CHLORIDE 0.9 % IV SOLN: INTRAVENOUS | Status: DC

## 2012-12-21 MED ORDER — CEFEPIME HCL 2 G IJ SOLR
INTRAMUSCULAR | Status: AC
  Filled 2012-12-21: qty 2

## 2012-12-21 MED ORDER — DEXTROSE 5 % IV SOLN
2.0000 g | Freq: Once | INTRAVENOUS | Status: AC
  Administered 2012-12-21: 2 g via INTRAVENOUS
  Filled 2012-12-21: qty 2

## 2012-12-21 MED ORDER — IOHEXOL 300 MG/ML  SOLN: 100.0000 mL | Freq: Once | INTRAMUSCULAR | Status: AC | PRN

## 2012-12-21 MED ORDER — SODIUM CHLORIDE 0.9 % IV BOLUS (SEPSIS)
1000.0000 mL | Freq: Once | INTRAVENOUS | Status: AC
  Administered 2012-12-21: 1000 mL via INTRAVENOUS

## 2012-12-21 MED ORDER — DEXTROSE 5 % IV SOLN
2.0000 g | Freq: Once | INTRAVENOUS | Status: DC
  Filled 2012-12-21: qty 2

## 2012-12-21 MED ORDER — SODIUM CHLORIDE 0.9 % IV SOLN
Freq: Once | INTRAVENOUS | Status: AC
  Administered 2012-12-21: 13:00:00 via INTRAVENOUS

## 2012-12-21 MED ORDER — LORAZEPAM 2 MG/ML IJ SOLN
0.5000 mg | Freq: Once | INTRAMUSCULAR | Status: AC
  Administered 2012-12-21: 0.5 mg via INTRAVENOUS
  Filled 2012-12-21: qty 1

## 2012-12-21 NOTE — ED Notes (Addendum)
EDP aware of pt v/s and temp. EDP give verbal order to bolus warm fluids. EDP reported would go an re-assess pt and talk with family.

## 2012-12-21 NOTE — ED Provider Notes (Signed)
History    87yf sent from NH for evaluation of declining mental status. Hx of dementia but this is an acute change for her. Unclear of exact onset. NH report brief and no EMS spresent when I evaluated pt. No report of recent trauma. Poor appetite in past couple days. No fever, but today noted to be hypothermic. No recent medication changes. Pt unable to provide any history.   CSN: [id number] Arrival date & time 12/21/12  1240  First MD Initiated Contact with Patient 12/21/12 1307     Chief Complaint  Patient presents with  . Altered Mental Status   (Consider location/radiation/quality/duration/timing/severity/associated sxs/prior Treatment) HPI Past Medical History  Diagnosis Date  . Dementia   . Falls frequently   . CAD (coronary artery disease)   . Hypothyroidism   . Meniere disease   . Hyperlipidemia   . Osteoporosis   . Back pain   . PONV (postoperative nausea and vomiting)   . COPD (chronic obstructive pulmonary disease)   . Anemia    Past Surgical History  Procedure Laterality Date  . Colonoscopy  Oct 2008    Dr. Darrick Penna: pancolonic diverticulosis, tubular adenomas, due for surveillance in 5 years  . Esophagogastroduodenoscopy N/A 10/04/2012    WUJ:WJXB gastric ulcer involving a large part of the body of the stomach with opening to a patent small bowel limb.?unknown GI surgery remotely vs fistula. H.Pylori+. NSAID use.   Family History  Problem Relation Age of Onset  . Stroke Mother   . Diabetes Mother   . Heart disease Father   . Heart disease Sister   . Diabetes Sister    History  Substance Use Topics  . Smoking status: Former Smoker -- 1.00 packs/day for 60 years    Types: Cigarettes    Quit date: 05/31/2011  . Smokeless tobacco: Never Used  . Alcohol Use: No   OB History   Grav Para Term Preterm Abortions TAB SAB Ect Mult Living                 Review of Systems  Level 5 caveat applies because pt is critically ill and unresponsive to questioning.     Allergies  Codeine; Penicillins; and Sulfonamide derivatives  Home Medications   Current Outpatient Rx  Name  Route  Sig  Dispense  Refill  . amLODipine (NORVASC) 5 MG tablet   Oral   Take 5 mg by mouth daily.         Marland Kitchen aspirin 81 MG tablet   Oral   Take 81 mg by mouth daily.         . Cranberry 450 MG TABS   Oral   Take 1 tablet by mouth daily.         Marland Kitchen donepezil (ARICEPT) 10 MG tablet   Oral   Take 10 mg by mouth at bedtime.         . iron polysaccharides (NIFEREX) 150 MG capsule   Oral   Take 1 capsule (150 mg total) by mouth daily.   30 capsule   3   . lactulose (CHRONULAC) 10 GM/15ML solution   Oral   Take 10 g by mouth 3 (three) times daily.          Marland Kitchen levothyroxine (SYNTHROID, LEVOTHROID) 75 MCG tablet   Oral   Take 75 mcg by mouth daily.           Marland Kitchen nystatin (MYCOSTATIN/NYSTOP) 100000 UNIT/GM POWD   Topical   Apply 1 g  topically 3 (three) times daily.          . pantoprazole (PROTONIX) 40 MG tablet   Oral   Take 1 tablet (40 mg total) by mouth 2 (two) times daily before a meal.         . pravastatin (PRAVACHOL) 40 MG tablet   Oral   Take 40 mg by mouth at bedtime.          . thiamine (VITAMIN B-1) 100 MG tablet   Oral   Take 100 mg by mouth daily.         . traMADol (ULTRAM) 50 MG tablet   Oral   Take 50 mg by mouth every 6 (six) hours as needed for pain.         Marland Kitchen albuterol (PROVENTIL HFA;VENTOLIN HFA) 108 (90 BASE) MCG/ACT inhaler   Inhalation   Inhale 2 puffs into the lungs every 6 (six) hours as needed for wheezing or shortness of breath.         Marland Kitchen albuterol (PROVENTIL) (2.5 MG/3ML) 0.083% nebulizer solution   Nebulization   Take 2.5 mg by nebulization every 6 (six) hours as needed for wheezing.          BP 108/59  Pulse 70  Temp(Src) 94.5 F (34.7 C) (Rectal)  Resp 14  SpO2 95% Physical Exam  Nursing note and vitals reviewed. Constitutional:  Laying in bed with eyes closed. Ill appearing.   HENT:   Head: Normocephalic and atraumatic.  Eyes: Conjunctivae are normal. Right eye exhibits no discharge. Left eye exhibits no discharge.  Neck: Neck supple.  Cardiovascular: Regular rhythm and normal heart sounds.  Exam reveals no gallop and no friction rub.   No murmur heard. Bradycardic. Weak radial pulses b/l.   Pulmonary/Chest: Effort normal and breath sounds normal. No respiratory distress.  Abdominal: Soft. She exhibits no distension. There is tenderness.  Diffuse abdominal tenderness with guarding. No rebound. No distension.   Musculoskeletal: She exhibits no edema.  Apparent pain with attempted ROM of b/l hips. No shortening or malrotation noted. Pt actively fully extending both hips and resisting abduction when trying to place foley.   Neurological:  GCS 9: E2, V2, M5. Moving all extremities with good strength. No facial droop.   Skin: Skin is dry.  Extremities cool to touch   Psychiatric:  Unable to assess because of mental status    ED Course  Procedures (including critical care time)  CRITICAL CARE Performed by: Raeford Razor  Total critical care time: 80 minutes  Critical care time was exclusive of separately billable procedures and treating other patients. Critical care was necessary to treat or prevent imminent or life-threatening deterioration. Critical care was time spent personally by me on the following activities: development of treatment plan with patient and/or surrogate as well as nursing, discussions with consultants, evaluation of patient's response to treatment, examination of patient, obtaining history from patient or surrogate, ordering and performing treatments and interventions, ordering and review of laboratory studies, ordering and review of radiographic studies, pulse oximetry and re-evaluation of patient's condition.  Labs Reviewed  CBC WITH DIFFERENTIAL - Abnormal; Notable for the following:    RBC 3.67 (*)    Hemoglobin 10.0 (*)    HCT 30.6 (*)     RDW 19.6 (*)    Neutrophils Relative % 79 (*)    All other components within normal limits  COMPREHENSIVE METABOLIC PANEL - Abnormal; Notable for the following:    Albumin 3.0 (*)    Alkaline  Phosphatase 124 (*)    GFR calc non Af Amer 76 (*)    GFR calc Af Amer 88 (*)    All other components within normal limits  URINALYSIS, ROUTINE W REFLEX MICROSCOPIC - Abnormal; Notable for the following:    APPearance CLOUDY (*)    Specific Gravity, Urine >1.030 (*)    Hgb urine dipstick MODERATE (*)    Ketones, ur TRACE (*)    Protein, ur TRACE (*)    Nitrite POSITIVE (*)    Leukocytes, UA TRACE (*)    All other components within normal limits  URINE MICROSCOPIC-ADD ON - Abnormal; Notable for the following:    Squamous Epithelial / LPF FEW (*)    Bacteria, UA MANY (*)    Casts HYALINE CASTS (*)    All other components within normal limits  CULTURE, BLOOD (ROUTINE X 2)  CULTURE, BLOOD (ROUTINE X 2)  URINE CULTURE  TROPONIN I  LACTIC ACID, PLASMA  TSH  TYPE AND SCREEN   Ct Abdomen Pelvis Wo Contrast  12/21/2012   *RADIOLOGY REPORT*  Clinical Data: Back pain, dementia, coronary artery disease, COPD  CT ABDOMEN AND PELVIS WITHOUT CONTRAST  Technique:  Multidetector CT imaging of the abdomen and pelvis was performed following the standard protocol without intravenous contrast.  Comparison: CT pelvis November 12, 2012, CT abdomen pelvis Oct 03, 2012  Findings: There is a small calcified granuloma within the liver. The liver is otherwise normal.  The spleen, pancreas, gallbladder, adrenal glands, and right kidney are normal.  There is a cyst in the mid pole left kidney unchanged.  There is no hydronephrosis bilaterally.  There is atherosclerosis of the abdominal aorta without aneurysmal dilatation.  There is no abdominal lymphadenopathy.  There is no small bowel obstruction or diverticulitis.  Images of the pelvis demonstrate decompressed bladder with Foley catheter in place.  The uterus is not  definitely seen.Images of the lung bases demonstrate scarring and mild dependent atelectasis of the lung bases.  The images of the bones demonstrate chronic compression deformity of T10, L2, L4 unchanged.  The previously noted fracture of the right pubic bone appears worsened and more angulated.  There is new deformity of the right inferior pubic rami.  These are consistent with superimposed acute fractures since prior CT of November 17, 2012. There is interval visualization of cortical discontinuity with adjacent sclerosis in the posterior aspect of the right ilium near the sacroiliac joints suspicious for acute fracture, new since prior exam.  There is also abnormal lucency and sclerosis of the right sacral ila consistent with acute fracture.  IMPRESSION: No acute abnormality identified in the abdomen and pelvis.  There are new acute fractures involving the right sacrum, right ilium and right pubic bone as well as right inferior pubic rami as described.   Original Report Authenticated By: Sherian Rein, M.D.   Ct Head Wo Contrast  12/21/2012   *RADIOLOGY REPORT*  Clinical Data: Altered mental status, history of dementia, frequent falls  CT HEAD WITHOUT CONTRAST  Technique:  Contiguous axial images were obtained from the base of the skull through the vertex without contrast.  Comparison: 11/16/2012; 11/12/2012  Findings:  Redemonstrated diffuse atrophy with sulcal prominence and centralized volume loss with commiserate ex vacuo dilatation of the ventricular system.  Scattered periventricular hypodensities compatible microvascular scan disease.  Given background parenchymal abnormalities, there is no CT evidence of acute large territory infarct.  No intraparenchymal or extra-axial mass or hemorrhage.  Unchanged size and configuration of the  ventricles and basilar cisterns.  No midline shift.  Vascular calcifications within the bilateral carotid siphons.  Limited visualization of the paranasal sinuses and mastoid air  cells are normal.  Post bilateral cataract surgery.  The regional soft tissues are normal.  No displaced calvarial fracture.  IMPRESSION: Stable findings of atrophy and microvascular ischemic disease without acute intracranial process.   Original Report Authenticated By: Tacey Ruiz, MD   Dg Chest Portable 1 View  12/21/2012   *RADIOLOGY REPORT*  Clinical Data:  Altered mental status, hypothermia, history coronary artery disease, COPD  PORTABLE CHEST - 1 VIEW  Comparison: Portable exam 1323 hours compared to 11/16/2012  Findings: Enlargement of cardiac silhouette. Calcified tortuous aorta. Pulmonary vascularity normal. Peribronchial thickening with minimal bibasilar atelectasis. No gross infiltrate, pleural effusion or pneumothorax. Bones demineralized.  IMPRESSION: Bronchitic changes with minimal bibasilar atelectasis.   Original Report Authenticated By: Ulyses Southward, M.D.   EKG: poor quality with artifact in multiple leads Rhythm: normal sinus 1st degree av block Vent. rate 78 BPM PR interval 216 ms QRS duration 60 ms QT/QTc 348/396 ms: ST segments: NS ST changes   1. Septic shock   2. UTI (urinary tract infection)     MDM   3:00 PM Pt remains hypothermic and hypotensive. Likely septic. UA consistent with UTI. Additional bolus of warmed IVF. Continued bare hugger.   3:13 PM Daughter at bedside. Updated on course thus far. Discussed severity of pt's illness. Confirms pt DNR. Still no urine noted from foley/hypotension. Additional IVF.   4:44 PM Unfortunately pt's hypotension has been refractory to IVF. Cefepime ordered in case causitive organism quinoline resistant. Daughter remains at bedside. Becoming more bradycardic despite temp rising. Apparently had a brief episode of apnea as well. CT as above. Possible acute pelvic fx, but no free fluid or anything else which would further explain hypotension. Will continue with IVF but not pursuit more aggressive measures per pt/family wishes.    5:53 PM Discussed with Dr Renard Matter for admission. Increasing bradycardia in face of worsening hypotension and despite rewarming is ominous. Breathing increasingly more shallow and irregular. Continued discussion with family and would like to make pt comfort care.  Explained rationale for medications for air hunger/comfort. Requesting spiritual care. Will hold in ED for a short period of time as I suspect pt may pass quickly. If does not, will bridge to floor.   Raeford Razor, MD 12/21/12 [phone]

## 2012-12-21 NOTE — ED Notes (Addendum)
Pt HR decreasing. Palpable pulse faint on bilateral radial wrists. EDP at bedside and assessed pt left carotid pulse. Pt breathing irregular. EDP consulted with family concerning pt status and further treatment planning. EDP give verbal order to give pt continuous infusion at 166ml/hr once 4th bolus infuses. Awaiting family decision concerning comfort care versus abx and fluids.

## 2012-12-21 NOTE — ED Notes (Signed)
Pt had cooling blanket placed x30 minutes, minimal improvement in pt temperature. Pt placed on bear hugger. Pt trembling stopped and pt no longer restless.

## 2012-12-21 NOTE — ED Notes (Signed)
Pt here from [facility] for evaluation of decreased LOC and low temp

## 2012-12-21 NOTE — ED Notes (Signed)
Notified Dr Renard Matter of changes in patient and family wishes.  Will give the Maxipine 2 gm as previously ordered.  Patient will continue as med-surg admission.

## 2012-12-21 NOTE — ED Notes (Signed)
Pt family reports pt "turned blue" for a few seconds and quit breathing. Pt daughter reports pt was stimulated and pt woke up and is now breathing. Pt sleeping with symmetrical chest rise and fall noted. EDP aware and reported would re-assess pt once returned from CT.

## 2012-12-21 NOTE — ED Notes (Signed)
EDP reports family decided to put pt on comfort care measures at this time.

## 2012-12-21 NOTE — ED Notes (Signed)
EDP aware of pt v/s. EDP give verbal order to admin another bolus. EDP at bedside.

## 2012-12-21 NOTE — ED Notes (Addendum)
Verified with EDP concerning CT with contrast. EDP give verbal order for IV contrast CT only. Pt not able to drink PO, CT called and informed.

## 2012-12-21 NOTE — ED Notes (Signed)
Due to patients response - family prefers to continue to provide treatment for patient above comfort care only.  Vital signs are improving, urine output is now flowing, temperature is being maintained without bair hugger.  Dr Juleen China advised this RN to notify Dr Renard Matter of family wishes.

## 2012-12-22 LAB — AMMONIA: Ammonia: 27 umol/L (ref 11–60)

## 2012-12-22 LAB — CBC
Hemoglobin: 8.6 g/dL — ABNORMAL LOW (ref 12.0–15.0)
MCH: 26.9 pg (ref 26.0–34.0)
MCHC: 32.1 g/dL (ref 30.0–36.0)
RDW: 19.7 % — ABNORMAL HIGH (ref 11.5–15.5)

## 2012-12-22 LAB — FOLATE: Folate: 8 ng/mL

## 2012-12-22 LAB — CREATININE, SERUM: Creatinine, Ser: 0.75 mg/dL (ref 0.50–1.10)

## 2012-12-22 LAB — VITAMIN B12: Vitamin B-12: 193 pg/mL — ABNORMAL LOW (ref 211–911)

## 2012-12-22 LAB — TSH: TSH: 2.748 u[IU]/mL (ref 0.350–4.500)

## 2012-12-22 MED ORDER — MORPHINE SULFATE 2 MG/ML IJ SOLN
2.0000 mg | INTRAMUSCULAR | Status: DC | PRN
  Administered 2012-12-25 – 2012-12-26 (×2): 2 mg via INTRAVENOUS
  Filled 2012-12-22 (×3): qty 1

## 2012-12-22 MED ORDER — VANCOMYCIN HCL 500 MG IV SOLR
500.0000 mg | Freq: Two times a day (BID) | INTRAVENOUS | Status: DC
  Administered 2012-12-22 – 2012-12-25 (×7): 500 mg via INTRAVENOUS
  Filled 2012-12-22 (×9): qty 500

## 2012-12-22 MED ORDER — DEXTROSE 5 % IV SOLN
2.0000 g | INTRAVENOUS | Status: DC
  Administered 2012-12-22 – 2012-12-24 (×3): 2 g via INTRAVENOUS
  Filled 2012-12-22 (×4): qty 2

## 2012-12-22 MED ORDER — ENOXAPARIN SODIUM 40 MG/0.4ML ~~LOC~~ SOLN
40.0000 mg | SUBCUTANEOUS | Status: DC
  Administered 2012-12-22 – 2012-12-28 (×7): 40 mg via SUBCUTANEOUS
  Filled 2012-12-22 (×8): qty 0.4

## 2012-12-22 MED ORDER — CIPROFLOXACIN IN D5W 400 MG/200ML IV SOLN
400.0000 mg | Freq: Two times a day (BID) | INTRAVENOUS | Status: DC
  Administered 2012-12-22 – 2012-12-25 (×7): 400 mg via INTRAVENOUS
  Filled 2012-12-22 (×9): qty 200

## 2012-12-22 NOTE — Progress Notes (Signed)
ANTIBIOTIC CONSULT NOTE  Pharmacy Consult for Vancomycin and Cefepime Indication: rule out sepsis and UTI  Allergies  Allergen Reactions  . Codeine   . Penicillins   . Sulfonamide Derivatives     Patient Measurements: Height: 5\' 3"  (160 cm) Weight: 132 lb 12.8 oz (60.238 kg) IBW/kg (Calculated) : 52.4  Vital Signs: Temp: 97.4 F (36.3 C) (07/25 0617) Temp src: Axillary (07/25 0617) BP: 115/57 mmHg (07/25 0647) Pulse Rate: 68 (07/25 0617) Intake/Output from previous day: 07/24 0701 - 07/25 0700 In: 1000 [I.V.:1000] Out: 815 [Urine:815] Intake/Output from this shift:    Labs:  Recent Labs  12/21/12 1350 12/22/12 0924  WBC 7.9 5.5  HGB 10.0* 8.6*  PLT 150 129*  CREATININE 0.73 0.75   Estimated Creatinine Clearance: 42.5 ml/min (by C-G formula based on Cr of 0.75). No results found for this basename: VANCOTROUGH, VANCOPEAK, VANCORANDOM, GENTTROUGH, GENTPEAK, GENTRANDOM, TOBRATROUGH, TOBRAPEAK, TOBRARND, AMIKACINPEAK, AMIKACINTROU, AMIKACIN,  in the last 72 hours   Microbiology: No results found for this or any previous visit (from the past 720 hour(s)).  Medical History: Past Medical History  Diagnosis Date  . Dementia   . Falls frequently   . CAD (coronary artery disease)   . Hypothyroidism   . Meniere disease   . Hyperlipidemia   . Osteoporosis   . Back pain   . PONV (postoperative nausea and vomiting)   . COPD (chronic obstructive pulmonary disease)   . Anemia    Medications:  Prescriptions prior to admission  Medication Sig Dispense Refill  . amLODipine (NORVASC) 5 MG tablet Take 5 mg by mouth daily.      Marland Kitchen aspirin 81 MG tablet Take 81 mg by mouth daily.      . Cranberry 450 MG TABS Take 1 tablet by mouth daily.      Marland Kitchen donepezil (ARICEPT) 10 MG tablet Take 10 mg by mouth at bedtime.      . iron polysaccharides (NIFEREX) 150 MG capsule Take 1 capsule (150 mg total) by mouth daily.  30 capsule  3  . lactulose (CHRONULAC) 10 GM/15ML solution Take 10  g by mouth 3 (three) times daily.       Marland Kitchen levothyroxine (SYNTHROID, LEVOTHROID) 75 MCG tablet Take 75 mcg by mouth daily.        Marland Kitchen nystatin (MYCOSTATIN/NYSTOP) 100000 UNIT/GM POWD Apply 1 g topically 3 (three) times daily.       . pantoprazole (PROTONIX) 40 MG tablet Take 1 tablet (40 mg total) by mouth 2 (two) times daily before a meal.      . pravastatin (PRAVACHOL) 40 MG tablet Take 40 mg by mouth at bedtime.       . thiamine (VITAMIN B-1) 100 MG tablet Take 100 mg by mouth daily.      . traMADol (ULTRAM) 50 MG tablet Take 50 mg by mouth every 6 (six) hours as needed for pain.      Marland Kitchen albuterol (PROVENTIL HFA;VENTOLIN HFA) 108 (90 BASE) MCG/ACT inhaler Inhale 2 puffs into the lungs every 6 (six) hours as needed for wheezing or shortness of breath.      Marland Kitchen albuterol (PROVENTIL) (2.5 MG/3ML) 0.083% nebulizer solution Take 2.5 mg by nebulization every 6 (six) hours as needed for wheezing.       Assessment: Okay for Protocol Estimated Creatinine Clearance: 42.5 ml/min (by C-G formula based on Cr of 0.75). Patient received a dose of Cefepime and Vancomycin yesterday. Patient also on Cipro 400mg  IV.  Cipro 7/24 >> Cefepime 7/24 >>  Vancomycin 7/24 >>  Goal of Therapy:  Vancomycin trough level 15-20 mcg/ml  Plan:  Cefepime 2000mg  IV every 24 hours. Vancomycin 500mg  IV every 12 hours. Measure antibiotic drug levels at steady state Follow up culture results  Mady Gemma 12/22/2012,11:20 AM

## 2012-12-22 NOTE — Progress Notes (Signed)
Nutrition Brief Note  Chart reviewed. Pt now transitioning to comfort care.  No further nutrition interventions warranted at this time.  Please re-consult as needed.   Lynn Saxon Barich MS,RD,LDN Office: #[phone] Pager: #[phone]  

## 2012-12-22 NOTE — Clinical Social Work Psychosocial (Signed)
Clinical Social Work Department BRIEF PSYCHOSOCIAL ASSESSMENT 12/22/2012  Patient:  [name] [name],[name] [name]     Account Number:  [id number]     Admit date:  12/21/2012  Clinical Social Worker:  Nancie Neas  Date/Time:  12/22/2012 10:40 AM  Referred by:  CSW  Date Referred:  12/22/2012 Referred for  ALF Placement   Other Referral:   Interview type:  Family Other interview type:   daughter- [name] [name]    PSYCHOSOCIAL DATA Living Status:  FACILITY Admitted from facility:  [facility] Level of care:  Assisted Living Primary support name:  [name] [name] Primary support relationship to patient:  CHILD, ADULT Degree of support available:   supportive    CURRENT CONCERNS Current Concerns  Post-Acute Placement   Other Concerns:    SOCIAL WORK ASSESSMENT / PLAN CSW met with pt's daughter, [name] [name] and son-in-law Alinda Money. Pt is currently not responding. She is well known to CSW from previous admissions. Pt is a resident at [facility] on the memory care unit. After last admission in hospital, pt recovered very well and was back to wheeling herself around in her wheelchair and feeding herself. Pt was brought to ED with AMS and hypothermia. Daughter indicates they did not feel pt would make it through the night. She was made comfort care at that time. Family then noticed small signs of improvement and requested to start treatment. Plan is to see how pt does over the next few days. CSW updated [id number] who is also in agreement with plan.   Assessment/plan status:  Psychosocial Support/Ongoing Assessment of Needs Other assessment/ plan:   Information/referral to community resources:   [employer]    PATIENT'S/FAMILY'S RESPONSE TO PLAN OF CARE: Pt unable to discuss plan of care. Family has been very happy at [facility]. CSW will follow up on Monday to determine appropriate d/c plan based on pt's progress.       Derenda Fennel, [place] [phone]

## 2012-12-22 NOTE — Care Management Note (Signed)
    Page 1 of 2   12/29/2012     9:48:44 AM   CARE MANAGEMENT NOTE 12/29/2012  Patient:  [name] [name]   Account Number:  [id number]  Date Initiated:  12/22/2012  Documentation initiated by:  Sharrie Rothman  Subjective/Objective Assessment:   Pt admitted from [facility] with sepsis. Pt is now DNR, but family has refused comfort care for now and wants full tx. PCP stated that pt would be watched for a few days and if not improvement, then PCP would address comfort care again.     Action/Plan:   CSW to arrange discharge when medically stable.   Anticipated DC Date:  12/25/2012   Anticipated DC Plan:  ASSISTED LIVING / REST HOME  In-house referral  Clinical Social Worker      DC Planning Services  CM consult      PAC Choice  HOSPICE   Choice offered to / List presented to:  C-4 Adult Children           HH agency  [facility]   Status of service:  Completed, signed off Medicare Important Message given?   (If response is "NO", the following Medicare IM given date fields will be blank) Date Medicare IM given:   Date Additional Medicare IM given:    Discharge Disposition:  HOSPICE MEDICAL FACILITY  Per UR Regulation:    If discussed at Long Length of Stay Meetings, dates discussed:   12/26/2012  12/28/2012    Comments:  12/29/12 0945 Arlyss Queen, RN BSN CM Pt discharged to [facility]. CSW to arrange discharge to facility.  12/22/12 1050 Arlyss Queen, RN BSN CM

## 2012-12-22 NOTE — H&P (Signed)
NAMEMALENA, TIMPONE NO.:  [id number]  MEDICAL RECORD NO.:  [id number]  LOCATION:  A316                          FACILITY:  [facility]  PHYSICIAN:  Melvyn Novas, MDDATE OF BIRTH:  Aug 13, 1925  DATE OF ADMISSION:  12/21/2012 DATE OF DISCHARGE:  LH                             HISTORY & PHYSICAL   The patient is an 79 year old demented white female, resident of [place], who was quite alive, eating chicken 48 hours before admission, has a history of dementia, frequent falls, compression fractures, hypothyroidism which is treated.  Apparently she had been complaining of feeling cold according to the daughter, was seen in the emergency room for declining mental status acute on chronic and was noted to be hypothermic with a temperature of 94.  CAT scan and chest x- ray reveals no evidence of infiltrate.  She does have a UTI, however, CAT scan reveals multiple new fractures of pubic rami and more advanced multiple compression fractures of lumbosacral spine.  The patient responds to tactile stimuli currently and has Kussmaul respirations. She is in no code.  PAST MEDICAL HISTORY:  Significant for the aforementioned dementia, frequent falls, coronary artery disease, nonobstructive hypothyroidism, Meniere's disease, osteoporosis, compression fractures, and COPD.  PAST SURGICAL HISTORY:  Remarkable for colonoscopy revealing pan colonic diverticulosis in 2008.  An EGD done in May 2014 revealed gastric ulcer for which she had multiple transfusions and is currently on Protonix I believe.  REVIEW OF SYSTEMS:  Unobtainable due to mental status.  CURRENT MEDICINES: 1. Norvasc 5 mg p.o. daily. 2. Aspirin 81 mg p.o. daily. 3. Aricept 10 mg p.o. at bedtime. 4. Niferex 150 mg p.o. daily. 5. Lactulose 10 g p.o. t.i.d. 6. Synthroid 75 mcg p.o. daily. 7. Protonix 40 mg p.o. daily. 8. Pravachol 40 mg p.o. daily. 9. Thiamine 100 mg p.o. daily. 10.Tramadol 50 q.6 h.  p.r.n. 11.Albuterol inhaler as directed.  Blood pressure is 115/57, temperature 97.4, pulse 68 and regular, respiratory rate is 15, Kussmaul type respirations.  WBCs of 5.5, hemoglobin drifted down from 10 to 8.6 possibly partially delusional. Urinalysis reveals evidence for UTI.  Lactic acid is less than 1.  IMPRESSION: 1. Urinary tract infection, possible septicemia. 2. Possible delirium superimposed upon dementia. 3. Multiple new compression fractures of LS spine as well as pubic     rami. 4. Chronic dementia with worsening mental status. 5. Hypertension. 6. Hyperlipidemia. 7. Hypothyroidism well compensated. 8. Serum ammonia level is within normal limits.  TSH is pending at     this time.  PLAN:  Right now is the patient appears to have the dwindles, possible sepsis.  She is currently controlled on Maxipime and vancomycin as well as Cipro for UTI.  We will continue same antibiotics for now.  Cultures are pending.  She is a DNR.  I discussed this with her daughter and husband.  We will monitor electrolytes, hemoglobin, and provide analgesia.  The prognosis is not very good at present, and the patient's family is aware.     Melvyn Novas, MD     RMD/MEDQ  D:  12/22/2012  T:  12/22/2012  Job:  [id number]

## 2012-12-22 NOTE — H&P (Signed)
[id number] 

## 2012-12-23 LAB — URINE CULTURE: Colony Count: >=100000

## 2012-12-23 LAB — HEPATIC FUNCTION PANEL
ALT: 14 U/L (ref 0–35)
Albumin: 2.7 g/dL — ABNORMAL LOW (ref 3.5–5.2)
Total Protein: 6.4 g/dL (ref 6.0–8.3)

## 2012-12-23 LAB — CBC WITH DIFFERENTIAL/PLATELET
HCT: 31.3 % — ABNORMAL LOW (ref 36.0–46.0)
Hemoglobin: 10 g/dL — ABNORMAL LOW (ref 12.0–15.0)
Lymphocytes Relative: 22 % (ref 12–46)
Monocytes Absolute: 0.6 10*3/uL (ref 0.1–1.0)
Monocytes Relative: 10 % (ref 3–12)
Neutro Abs: 3.7 10*3/uL (ref 1.7–7.7)
WBC: 5.8 10*3/uL (ref 4.0–10.5)

## 2012-12-23 LAB — BASIC METABOLIC PANEL
Calcium: 8.9 mg/dL (ref 8.4–10.5)
Chloride: 111 mEq/L (ref 96–112)
Creatinine, Ser: 0.78 mg/dL (ref 0.50–1.10)
GFR calc Af Amer: 86 mL/min — ABNORMAL LOW (ref >90)
Sodium: 145 mEq/L (ref 135–145)

## 2012-12-23 MED ORDER — POTASSIUM CHLORIDE 10 MEQ/100ML IV SOLN
10.0000 meq | INTRAVENOUS | Status: AC
  Administered 2012-12-23 (×4): 10 meq via INTRAVENOUS
  Filled 2012-12-23 (×2): qty 100

## 2012-12-23 MED ORDER — POTASSIUM CHLORIDE 10 MEQ/100ML IV SOLN
INTRAVENOUS | Status: AC
  Filled 2012-12-23: qty 100

## 2012-12-23 NOTE — Progress Notes (Signed)
ANTICOAGULATION CONSULT NOTE - Initial Consult  Pharmacy Consult for Lovenox Indication: VTE prophylaxis  Allergies  Allergen Reactions  . Codeine   . Penicillins   . Sulfonamide Derivatives     Patient Measurements: Height: 5\' 3"  (160 cm) Weight: 132 lb 12.8 oz (60.238 kg) IBW/kg (Calculated) : 52.4  Vital Signs: Temp: 98.1 F (36.7 C) (07/26 0525) Temp src: Oral (07/26 0525) BP: 152/72 mmHg (07/26 0525) Pulse Rate: 70 (07/26 0824)  Labs:  Recent Labs  12/21/12 1350 12/22/12 0924 12/23/12 0603  HGB 10.0* 8.6* 10.0*  HCT 30.6* 26.8* 31.3*  PLT 150 129* 149*  CREATININE 0.73 0.75 0.78  TROPONINI <0.30  --   --     Estimated Creatinine Clearance: 42.5 ml/min (by C-G formula based on Cr of 0.78).   Medical History: Past Medical History  Diagnosis Date  . Dementia   . Falls frequently   . CAD (coronary artery disease)   . Hypothyroidism   . Meniere disease   . Hyperlipidemia   . Osteoporosis   . Back pain   . PONV (postoperative nausea and vomiting)   . COPD (chronic obstructive pulmonary disease)   . Anemia     Medications:  Scheduled:  . ceFEPIme (MAXIPIME) 2 GM IVP  2 g Intravenous Q24H  . ciprofloxacin  400 mg Intravenous Q12H  . enoxaparin (LOVENOX) injection  40 mg Subcutaneous Q24H  . vancomycin  500 mg Intravenous Q12H    Assessment: 79 yo F admitted with UTI/sepsis to start on Lovenox for VTE px while hospitalized.  No bleeding noted.  Renal fxn & CBC have been stable since admission.   Goal of Therapy:  Monitor platelets by anticoagulation protocol: Yes   Plan:  Lovenox 40mg  sq daily Pharmacy to sign off.   Elson Clan 12/23/2012,8:49 AM

## 2012-12-23 NOTE — Progress Notes (Signed)
[id number] 

## 2012-12-23 NOTE — Progress Notes (Signed)
NAMEMARCUS, GROLL NO.:  [id number]  MEDICAL RECORD NO.:  [id number]  LOCATION:  A316                          FACILITY:  [facility]  PHYSICIAN:  Melvyn Novas, MDDATE OF BIRTH:  April 07, 1926  DATE OF PROCEDURE: DATE OF DISCHARGE:                                PROGRESS NOTE   The patient admitted yesterday with UTI with dwindles obtundation, multiple fractures of pubic rami and compression fracture of LS spine. The patient has significant dementia.  She was placed on 3 antibiotics, vanc and Maxipime and Cipro for UTI and possible septicemia.  Lactic acid was 1.  Today, she is hemodynamically improved.  PHYSICAL EXAMINATION:  VITAL SIGNS:  Blood pressure 152/72, temperature 98.1, pulse 71 and regular, respirations of 14. LUNGS:  Clear.  No rales, wheeze, or rhonchi. HEART:  Regular rhythm.  No murmurs, gallops, or rubs. ABDOMEN:  Soft, nontender.  Bowel sounds normoactive.  Hemoglobin 10.  WBC is 5.8, creatinine is 0.78.  Patient response to tactile and some verbal stimuli with daughter.  PLAN:  Right now is to check CBC and BMET in a.m.  Continue triple antibiotics, monitoring renal function, hemodynamics, and patient is in no code.  We will attempt to feed with full liquids to see if she desires to eat.     Melvyn Novas, MD     RMD/MEDQ  D:  12/23/2012  T:  12/23/2012  Job:  [id number]

## 2012-12-24 LAB — HEPATIC FUNCTION PANEL
ALT: 13 U/L (ref 0–35)
Alkaline Phosphatase: 121 U/L — ABNORMAL HIGH (ref 39–117)
Bilirubin, Direct: 0.1 mg/dL (ref 0.0–0.3)
Indirect Bilirubin: 0.3 mg/dL (ref 0.3–0.9)
Total Protein: 7.1 g/dL (ref 6.0–8.3)

## 2012-12-24 LAB — BASIC METABOLIC PANEL
BUN: 6 mg/dL (ref 6–23)
CO2: 27 mEq/L (ref 19–32)
Chloride: 104 mEq/L (ref 96–112)
Creatinine, Ser: 0.63 mg/dL (ref 0.50–1.10)
Glucose, Bld: 97 mg/dL (ref 70–99)

## 2012-12-24 LAB — CBC WITH DIFFERENTIAL/PLATELET
Basophils Absolute: 0 10*3/uL (ref 0.0–0.1)
Basophils Relative: 0 % (ref 0–1)
HCT: 33.6 % — ABNORMAL LOW (ref 36.0–46.0)
MCHC: 32.7 g/dL (ref 30.0–36.0)
Monocytes Absolute: 0.8 10*3/uL (ref 0.1–1.0)
Neutro Abs: 5.7 10*3/uL (ref 1.7–7.7)
RDW: 19.1 % — ABNORMAL HIGH (ref 11.5–15.5)

## 2012-12-24 MED ORDER — POTASSIUM CHLORIDE 10 MEQ/100ML IV SOLN: 10.0000 meq | INTRAVENOUS | Status: AC

## 2012-12-24 MED ORDER — POTASSIUM CHLORIDE 10 MEQ/100ML IV SOLN
10.0000 meq | INTRAVENOUS | Status: AC
  Administered 2012-12-24 (×4): 10 meq via INTRAVENOUS
  Filled 2012-12-24: qty 300
  Filled 2012-12-24: qty 100

## 2012-12-25 ENCOUNTER — Encounter (HOSPITAL_COMMUNITY): Payer: Medicare Other

## 2012-12-25 ENCOUNTER — Encounter (HOSPITAL_COMMUNITY)
Admission: EM | Disposition: A | Discharge status: Hospice | Payer: Self-pay | Source: Home / Self Care | Attending: Family Medicine

## 2012-12-25 ENCOUNTER — Ambulatory Visit (HOSPITAL_COMMUNITY): Admission: RE | Admit: 2012-12-25 | Payer: Medicare Other | Source: Ambulatory Visit | Admitting: Gastroenterology

## 2012-12-25 ENCOUNTER — Inpatient Hospital Stay (HOSPITAL_COMMUNITY): Payer: Medicare Other

## 2012-12-25 LAB — BASIC METABOLIC PANEL
Calcium: 9.2 mg/dL (ref 8.4–10.5)
Chloride: 102 mEq/L (ref 96–112)
Creatinine, Ser: 0.77 mg/dL (ref 0.50–1.10)
GFR calc Af Amer: 86 mL/min — ABNORMAL LOW (ref >90)

## 2012-12-25 LAB — CBC WITH DIFFERENTIAL/PLATELET
Basophils Absolute: 0 10*3/uL (ref 0.0–0.1)
Basophils Relative: 0 % (ref 0–1)
Eosinophils Absolute: 0 10*3/uL (ref 0.0–0.7)
Eosinophils Relative: 0 % (ref 0–5)
HCT: 31.6 % — ABNORMAL LOW (ref 36.0–46.0)
Lymphocytes Relative: 8 % — ABNORMAL LOW (ref 12–46)
MCH: 26.6 pg (ref 26.0–34.0)
MCHC: 32.3 g/dL (ref 30.0–36.0)
MCV: 82.3 fL (ref 78.0–100.0)
Monocytes Absolute: 0.9 10*3/uL (ref 0.1–1.0)
Platelets: 170 10*3/uL (ref 150–400)
RDW: 19.4 % — ABNORMAL HIGH (ref 11.5–15.5)

## 2012-12-25 LAB — HEPATIC FUNCTION PANEL
ALT: 12 U/L (ref 0–35)
AST: 14 U/L (ref 0–37)
Albumin: 2.6 g/dL — ABNORMAL LOW (ref 3.5–5.2)
Alkaline Phosphatase: 103 U/L (ref 39–117)
Total Bilirubin: 0.4 mg/dL (ref 0.3–1.2)
Total Protein: 6.7 g/dL (ref 6.0–8.3)

## 2012-12-25 SURGERY — EGD (ESOPHAGOGASTRODUODENOSCOPY)
Anesthesia: Moderate Sedation

## 2012-12-25 MED ORDER — DEXTROSE 5 % IV SOLN
1.0000 g | INTRAVENOUS | Status: DC
  Administered 2012-12-25 – 2012-12-28 (×4): 1 g via INTRAVENOUS
  Filled 2012-12-25 (×7): qty 10

## 2012-12-25 MED ORDER — SODIUM CHLORIDE 0.9 % IJ SOLN: 10.0000 mL | INTRAMUSCULAR | Status: DC | PRN

## 2012-12-25 MED ORDER — SODIUM CHLORIDE 0.9 % IJ SOLN
10.0000 mL | Freq: Two times a day (BID) | INTRAMUSCULAR | Status: DC
  Administered 2012-12-25 – 2012-12-29 (×5): 10 mL

## 2012-12-25 NOTE — Progress Notes (Signed)
[id number] 

## 2012-12-25 NOTE — Progress Notes (Addendum)
ANTIBIOTIC CONSULT NOTE  Pharmacy Consult for Vancomycin and Cefepime Indication: UTI  Allergies  Allergen Reactions  . Codeine   . Penicillins   . Sulfonamide Derivatives     Patient Measurements: Height: 5\' 3"  (160 cm) Weight: 132 lb 12.8 oz (60.238 kg) IBW/kg (Calculated) : 52.4  Vital Signs: Temp: 97.8 F (36.6 C) (07/28 0544) BP: 120/34 mmHg (07/28 0544) Pulse Rate: 67 (07/28 0544) Intake/Output from previous day: 07/27 0701 - 07/28 0700 In: -  Out: 525 [Urine:525] Intake/Output from this shift:    Labs:  Recent Labs  12/23/12 0603 12/24/12 0614 12/25/12 0456  WBC 5.8 8.2 17.9*  HGB 10.0* 11.0* 10.2*  PLT 149* 162 170  CREATININE 0.78 0.63 0.77   Estimated Creatinine Clearance: 42.5 ml/min (by C-G formula based on Cr of 0.77). No results found for this basename: VANCOTROUGH, VANCOPEAK, VANCORANDOM, GENTTROUGH, GENTPEAK, GENTRANDOM, TOBRATROUGH, TOBRAPEAK, TOBRARND, AMIKACINPEAK, AMIKACINTROU, AMIKACIN,  in the last 72 hours   Microbiology: Recent Results (from the past 720 hour(s))  URINE CULTURE     Status: None   Collection Time    12/21/12  1:16 PM      Result Value Range Status   Specimen Description URINE, CATHETERIZED   Final   Special Requests NONE   Final   Culture  Setup Time 12/22/2012 00:23   Final   Colony Count >=100,000 COLONIES/ML   Final   Culture ESCHERICHIA COLI   Final   Report Status 12/23/2012 FINAL   Final   Organism ID, Bacteria ESCHERICHIA COLI   Final  CULTURE, BLOOD (ROUTINE X 2)     Status: None   Collection Time    12/21/12  1:50 PM      Result Value Range Status   Specimen Description BLOOD LEFT HAND   Final   Special Requests BOTTLES DRAWN AEROBIC ONLY 4CC   Final   Culture NO GROWTH 3 DAYS   Final   Report Status PENDING   Incomplete  CULTURE, BLOOD (ROUTINE X 2)     Status: None   Collection Time    12/21/12  1:50 PM      Result Value Range Status   Specimen Description BLOOD RIGHT HAND   Final   Special  Requests BOTTLES DRAWN AEROBIC AND ANAEROBIC 6CC   Final   Culture NO GROWTH 3 DAYS   Final   Report Status PENDING   Incomplete    Medical History: Past Medical History  Diagnosis Date  . Dementia   . Falls frequently   . CAD (coronary artery disease)   . Hypothyroidism   . Meniere disease   . Hyperlipidemia   . Osteoporosis   . Back pain   . PONV (postoperative nausea and vomiting)   . COPD (chronic obstructive pulmonary disease)   . Anemia    Medications:  Prescriptions prior to admission  Medication Sig Dispense Refill  . amLODipine (NORVASC) 5 MG tablet Take 5 mg by mouth daily.      Marland Kitchen aspirin 81 MG tablet Take 81 mg by mouth daily.      . Cranberry 450 MG TABS Take 1 tablet by mouth daily.      Marland Kitchen donepezil (ARICEPT) 10 MG tablet Take 10 mg by mouth at bedtime.      . iron polysaccharides (NIFEREX) 150 MG capsule Take 1 capsule (150 mg total) by mouth daily.  30 capsule  3  . lactulose (CHRONULAC) 10 GM/15ML solution Take 10 g by mouth 3 (three) times daily.       Marland Kitchen  levothyroxine (SYNTHROID, LEVOTHROID) 75 MCG tablet Take 75 mcg by mouth daily.        Marland Kitchen nystatin (MYCOSTATIN/NYSTOP) 100000 UNIT/GM POWD Apply 1 g topically 3 (three) times daily.       . pantoprazole (PROTONIX) 40 MG tablet Take 1 tablet (40 mg total) by mouth 2 (two) times daily before a meal.      . pravastatin (PRAVACHOL) 40 MG tablet Take 40 mg by mouth at bedtime.       . thiamine (VITAMIN B-1) 100 MG tablet Take 100 mg by mouth daily.      . traMADol (ULTRAM) 50 MG tablet Take 50 mg by mouth every 6 (six) hours as needed for pain.      Marland Kitchen albuterol (PROVENTIL HFA;VENTOLIN HFA) 108 (90 BASE) MCG/ACT inhaler Inhale 2 puffs into the lungs every 6 (six) hours as needed for wheezing or shortness of breath.      Marland Kitchen albuterol (PROVENTIL) (2.5 MG/3ML) 0.083% nebulizer solution Take 2.5 mg by nebulization every 6 (six) hours as needed for wheezing.       Assessment: 79 yo F on day#5 triple antibiotic therapy for  +Ecoli UTI (resistant to Cipro; sens to cephalosporins).  Renal function has been stable. Increase in WBC today.    Rocephin 7/28>> Cipro 7/24 >>7/28 Cefepime 7/24 >>7/28 Vancomycin 7/24 >>7/28  Goal of Therapy:  Vancomycin trough level 15-20 mcg/ml  Plan:  Discussed with Dr Janna Arch- Change current antibiotics to Rocephin 1gm IV q24h Duration of therapy per MD Monitor renal function and cx data   [name] [name] 12/25/2012,2:30 PM

## 2012-12-25 NOTE — Progress Notes (Signed)
NAMEDERYA, DETTMANN NO.:  [id number]  MEDICAL RECORD NO.:  [id number]  LOCATION:  A316                          FACILITY:  [facility]  PHYSICIAN:  Melvyn Novas, MDDATE OF BIRTH:  12/29/1925  DATE OF PROCEDURE: DATE OF DISCHARGE:                                PROGRESS NOTE   The patient is an 79 year old female with baseline dementia, moderate degree came in with UTI and delirium, was sort of obtunded.  Sepsis workup essentially was negative, placed on triple antibiotics 72 hours ago.  Today, has been wide awake, back to her baseline mental status. Tolerating a full liquid diet.  Currently controlled on vanc and Maxipime as well as Cipro.  Creatinine is 0.63, potassium 3.0 being repleted intravenously.  Lungs are clear.  No rales, wheeze, or rhonchi. Heart regular rhythm.  No murmurs, gallops, or rubs.  Abdomen is soft, nontender.  Bowel sounds normoactive.  WBC is down to 8.2, blood pressure 146/72, afebrile.  Pulse is 80 and regular.  Plan right now is continue antibiotics with the withdrawal of 1 or 2 antibiotics in the next 24-48 hours, and out of bed to chair.     Melvyn Novas, MD     RMD/MEDQ  D:  12/24/2012  T:  12/24/2012  Job:  [id number]

## 2012-12-25 NOTE — Progress Notes (Signed)
UR chart review completed.  

## 2012-12-25 NOTE — Progress Notes (Signed)
[name] [name], [name] [name] NO.:  [id number]  MEDICAL RECORD NO.:  [id number]  LOCATION:  A316                          FACILITY:  [facility]  PHYSICIAN:  Melvyn Novas, MDDATE OF BIRTH:  [birth year]  DATE OF PROCEDURE: DATE OF DISCHARGE:                                PROGRESS NOTE   The patient is an 79 year old female who came in with UTI and delirium, was an extremis upon admission, perked up as of yesterday 48 hours with multiple antibiotics, Maxipime, vanc, and Cipro.  Since 12 noon yesterday, she has dwindled downward in her level of consciousness, was tolerating some full liquids with encouragement.  Her IV line is now infiltrated.  Discussion with the daughter about comfort care and PICC line and so forth.  My plan was to insert a PICC line for the patient's comfort to administer IV fluids, encourage p.o. fluids for another 24 hours to see the patient again improves her clinical condition or deteriorates.  Based on that, we will decide to not draw bloods and other measures.  Lungs are clear.  No rales, wheeze, or rhonchi.  Heart: Regular rhythm.  No murmurs, gallops, or rubs.  Abdomen is soft, nontender.  Bowel sounds normoactive.  Continue current therapy.  PICC line insertion and change diet to nectar thick consistency.     Melvyn Novas, MD     RMD/MEDQ  D:  12/25/2012  T:  12/25/2012  Job:  [id number]

## 2012-12-26 LAB — CBC WITH DIFFERENTIAL/PLATELET
Basophils Absolute: 0 10*3/uL (ref 0.0–0.1)
Eosinophils Absolute: 0.3 10*3/uL (ref 0.0–0.7)
HCT: 29 % — ABNORMAL LOW (ref 36.0–46.0)
Lymphocytes Relative: 15 % (ref 12–46)
MCHC: 32.4 g/dL (ref 30.0–36.0)
Neutro Abs: 6.9 10*3/uL (ref 1.7–7.7)
Platelets: 168 10*3/uL (ref 150–400)
RDW: 19.8 % — ABNORMAL HIGH (ref 11.5–15.5)

## 2012-12-26 LAB — CULTURE, BLOOD (ROUTINE X 2)
Culture: NO GROWTH
Culture: NO GROWTH

## 2012-12-26 NOTE — Progress Notes (Signed)
[id number] 

## 2012-12-26 NOTE — Clinical Social Work Note (Signed)
CSW spoke with MD who reports plan will be to transition pt to PO antibiotics if she can maintain hydration. CSW updated [address] who plan to assess pt later this afternoon.  Derenda Fennel, [place] [phone]

## 2012-12-27 LAB — FOLATE: Folate: 4.7 ng/mL

## 2012-12-27 LAB — CBC WITH DIFFERENTIAL/PLATELET
Basophils Absolute: 0 10*3/uL (ref 0.0–0.1)
Basophils Relative: 0 % (ref 0–1)
Hemoglobin: 8.5 g/dL — ABNORMAL LOW (ref 12.0–15.0)
MCHC: 31.7 g/dL (ref 30.0–36.0)
Monocytes Relative: 9 % (ref 3–12)
Neutro Abs: 5.8 10*3/uL (ref 1.7–7.7)
Neutrophils Relative %: 67 % (ref 43–77)
RDW: 19.6 % — ABNORMAL HIGH (ref 11.5–15.5)
WBC: 8.7 10*3/uL (ref 4.0–10.5)

## 2012-12-27 LAB — BASIC METABOLIC PANEL
Chloride: 107 mEq/L (ref 96–112)
GFR calc Af Amer: 89 mL/min — ABNORMAL LOW (ref >90)
Potassium: 3.3 mEq/L — ABNORMAL LOW (ref 3.5–5.1)

## 2012-12-27 LAB — IRON AND TIBC
Iron: 34 ug/dL — ABNORMAL LOW (ref 42–135)
TIBC: 267 ug/dL (ref 250–470)

## 2012-12-27 LAB — VITAMIN B12: Vitamin B-12: 235 pg/mL (ref 211–911)

## 2012-12-27 MED ORDER — PANTOPRAZOLE SODIUM 40 MG PO TBEC
40.0000 mg | DELAYED_RELEASE_TABLET | Freq: Every day | ORAL | Status: DC
  Administered 2012-12-27 – 2012-12-29 (×2): 40 mg via ORAL
  Filled 2012-12-27 (×2): qty 1

## 2012-12-27 MED ORDER — POTASSIUM CHLORIDE 10 MEQ/100ML IV SOLN
10.0000 meq | INTRAVENOUS | Status: AC
  Administered 2012-12-27 (×3): 10 meq via INTRAVENOUS
  Filled 2012-12-27 (×3): qty 100

## 2012-12-27 MED ORDER — POTASSIUM CHLORIDE 10 MEQ/100ML IV SOLN
INTRAVENOUS | Status: AC
  Administered 2012-12-27: 10 meq
  Filled 2012-12-27: qty 100

## 2012-12-27 NOTE — Evaluation (Signed)
Clinical/Bedside Swallow Evaluation Patient Details  Name: [name] [name] MRN: [id number] Date of Birth: May 27, 1926  Today's Date: 12/27/2012 Time: 1050-1120 SLP Time Calculation (min): 30 min  Past Medical History:  Past Medical History  Diagnosis Date  . Dementia   . Falls frequently   . CAD (coronary artery disease)   . Hypothyroidism   . Meniere disease   . Hyperlipidemia   . Osteoporosis   . Back pain   . PONV (postoperative nausea and vomiting)   . COPD (chronic obstructive pulmonary disease)   . Anemia    Past Surgical History:  Past Surgical History  Procedure Laterality Date  . Colonoscopy  Oct 2008    Dr. Darrick Penna: pancolonic diverticulosis, tubular adenomas, due for surveillance in 5 years  . Esophagogastroduodenoscopy N/A 10/04/2012    ION:GEXB gastric ulcer involving a large part of the body of the stomach with opening to a patent small bowel limb.?unknown GI surgery remotely vs fistula. H.Pylori+. NSAID use.   HPI:  The patient is an 79 year old female who came in with UTI and delirium. Pt is currently on a full liquid diet with reported difficulty swallowing mixed textures such as oatmeal, some puree, and thin liquids.    Assessment / Plan / Recommendation Clinical Impression  Pt seen in room today for BSE with daughter present. Daughter reported that pt presented new onset of coughing with oatmeal and thin liquids. In addition, daughter reported that at this point, she was mostly concerned with her mothers quality of life and was not interested in testing unless other diets were at least attempted. Pt was very distractible and was jargoning frequently but able to make eye contact. Pt required tactile cueing as she was unable to follow directions. Provided ice chips, and thin liquids via spoon presentations, there was suspected delayed swallow as well as decreased awareness of bolus. However, given pureed item, pt was more aware of item and swallowed with more control.  Noted that pt did demonstrate some delayed cough with puree, but this was when daughter fed with increased rate. SLP encouraged daughter to slow rate of feeds and decrease bite size. SLP recommended puree diet with nectar liquids to eliminate any meltable or mixed textures and to increase pt's quality of life. Recommend ST to follow up with pt on next day and to consider MBSS with daughter once more. Spoke with nursing regarding the risks of this upgrade as well as the safe swallow strategies necessary to maintain this pt's safety. Nursing was receptive and made aware to downgrade at any time if pt was not tolerating this upgrade.     Aspiration Risk  Mild (risk is present without use of compens strategies)    Diet Recommendation Dysphagia 1 (Puree);Nectar-thick liquid   Liquid Administration via: Spoon Medication Administration: Whole meds with puree Supervision: Staff feed patient;Intermittent supervision to cue for compensatory strategies Compensations: Slow rate;Small sips/bites;Check for pocketing;Clear throat intermittently Postural Changes and/or Swallow Maneuvers: Out of bed for meals;Seated upright 90 degrees;Upright 30-60 min after meal    Other  Recommendations Oral Care Recommendations: Oral care BID   Follow Up Recommendations    ST to follow up on next day, consider MBSS if family is willing   Frequency and Duration min 2x/week  2 weeks       SLP Swallow Goals Patient will consume recommended diet without observed clinical signs of aspiration with: Supervision/safety Patient will utilize recommended strategies during swallow to increase swallowing safety with: Supervision/safety   Swallow Study Prior Functional  Status       General Date of Onset: 12/27/12 HPI: The patient is an 79 year old female who came in with UTI and delirium. Pt is currently on a full liquid diet with reported difficulty swallowing mixed textures such as oatmeal, some puree, and thin liquids.  Type  of Study: Bedside swallow evaluation Diet Prior to this Study: Nectar-thick liquids;Other (Comment) (Full liquid diet) Respiratory Status: Supplemental O2 delivered via (comment) (nasal cannula) Behavior/Cognition: Confused;Doesn't follow directions;Requires cueing;Distractible;Alert Oral Cavity - Dentition: Dentures, not available;Missing dentition Self-Feeding Abilities: Needs assist Patient Positioning: Upright in chair Baseline Vocal Quality: Clear Volitional Cough: Weak Volitional Swallow: Unable to elicit    Oral/Motor/Sensory Function Overall Oral Motor/Sensory Function: Impaired Labial Strength: Reduced Lingual Strength: Reduced   Ice Chips Ice chips: Impaired Presentation: Spoon Oral Phase Impairments: Reduced lingual movement/coordination;Poor awareness of bolus Pharyngeal Phase Impairments: Suspected delayed Swallow;Cough - Delayed   Thin Liquid Thin Liquid: Impaired Presentation: Spoon Oral Phase Impairments: Poor awareness of bolus;Impaired anterior to posterior transit;Reduced lingual movement/coordination Pharyngeal  Phase Impairments: Suspected delayed Swallow;Throat Clearing - Immediate    Nectar Thick Nectar Thick Liquid: Not tested   Honey Thick Honey Thick Liquid: Not tested   Puree Puree: Within functional limits Presentation: Spoon Other Comments: Provided 1/2 tsp bites with ~30 seconds between swallow and next spoon presentation   Solid   GO    Solid: Not tested       Tayla Panozzo S 12/27/2012,11:55 AM

## 2012-12-27 NOTE — Progress Notes (Signed)
[id number] 

## 2012-12-27 NOTE — Progress Notes (Signed)
NUTRITION FOLLOW UP  Intervention:   Magic cup TID between meals, each supplement provides 290 kcal and 9 grams of protein.  Nutrition Dx:   Inadequate oral intake related to acute illness, swallow difficulty, altered mentation AEB UTI, bedside swallow study results, PMH and meal intake 0-5%.  Goal:   Pt to meet >/= 90% of their estimated nutrition needs   Monitor:   Po intake, labs and wt trends  Assessment:   RD drawn to pt due to poor po consult from nursing. Bedside swallow study today and diet advanced to Pureed with Nectar-thick liquids. She likes the [employer] will continue with those and monitor po intake meals. Inadequate oral intake. Goals of care conservative per MD will follow nutrition status.  Height: Ht Readings from Last 1 Encounters:  12/22/12 5\' 3"  (1.6 m)    Weight Status:   Wt Readings from Last 1 Encounters:  12/22/12 132 lb 12.8 oz (60.238 kg)  *no new wt since admission  Estimated needs:  Kcal: 1400-1600  Protein: 60-70 gr  Fluid: 1800 ml/day  Skin: No issues noted  Diet Order: Dysphagia 1/ Nectar-thick liquids   Intake/Output Summary (Last 24 hours) at 12/27/12 1541 Last data filed at 12/27/12 1256  Gross per 24 hour  Intake   1000 ml  Output   1175 ml  Net   -175 ml    Last BM: 12/24/12   Labs:   Recent Labs Lab 12/24/12 0614 12/25/12 0456 12/27/12 0442  NA 140 137 141  K 3.0* 4.5 3.3*  CL 104 102 107  CO2 27 28 28   BUN 6 9 12   CREATININE 0.63 0.77 0.71  CALCIUM 9.4 9.2 8.6  GLUCOSE 97 125* 94    CBG (last 3)  No results found for this basename: GLUCAP,  in the last 72 hours  Scheduled Meds: . cefTRIAXone (ROCEPHIN)  IV  1 g Intravenous Q24H  . enoxaparin (LOVENOX) injection  40 mg Subcutaneous Q24H  . pantoprazole  40 mg Oral Daily  . sodium chloride  10-40 mL Intracatheter Q12H    Continuous Infusions: . sodium chloride 1 mL (12/27/12 0728)    Royann Shivers MS,RD,LDN,CSG Office: [phone] Pager:  [phone]

## 2012-12-27 NOTE — Evaluation (Signed)
Physical Therapy Evaluation Patient Details Name: [name] [name] MRN: [id number] DOB: 02/17/1926 Today's Date: 12/27/2012 Time: 1191-4782 PT Time Calculation (min): 33 min  PT Assessment / Plan / Recommendation History of Present Illness   Pt is an 79 yo female admitted to [facility] with UTI  Clinical Impression  Pt is on dementia unit of assistive living facility.  Pt is unable to respond to command.  Pt was wheelchair bound prior to admission.  Pt is not in need of skilled physical therapy.    PT Assessment  Patent does not need any further PT services    Follow Up Recommendations  No PT follow up    Does the patient have the potential to tolerate intense rehabilitation    no  Barriers to Discharge  none      Equipment Recommendations     none  Recommendations for Other Services   none  Frequency      Precautions / Restrictions Precautions Precautions: Fall Restrictions Weight Bearing Restrictions: No         Mobility  Bed Mobility Bed Mobility: Rolling Left;Sit to Supine Rolling Left: 2: Max assist Sit to Supine: 2: Max assist Transfers Transfers: Sit to Stand Sit to Stand: 2: Max assist Ambulation/Gait Ambulation/Gait Assistance: Not tested (comment)    Exercises General Exercises - Lower Extremity Ankle Circles/Pumps: PROM;5 reps Long Arc Quad: PROM;5 reps Hip ABduction/ADduction: PROM;5 reps   PT Diagnosis:   none PT Problem List:   PT Treatment Interventions:       PT Goals(Current goals can be found in the care plan section)    Visit Information  Last PT Received On: 12/27/12       Prior Functioning  Home Living Family/patient expects to be discharged to:: Assisted living (Pt has not been ambulatory per daughter) Home Equipment: Wheelchair - manual Additional Comments: pt had been able to paddle w/c with feet prior to recent pelvic fx Prior Function Level of Independence: Needs assistance Comments: pt is on memory care unit of  [facility] Communication Communication: Receptive difficulties;Expressive difficulties    Cognition  Cognition Behavior During Therapy: Restless Overall Cognitive Status: History of cognitive impairments - at baseline    Extremity/Trunk Assessment Lower Extremity Assessment Lower Extremity Assessment: Difficult to assess due to impaired cognition   Balance    End of Session PT - End of Session Equipment Utilized During Treatment: Gait belt Activity Tolerance: Treatment limited secondary to medical complications (Comment) Patient left: in chair;with call bell/phone within reach;with chair alarm set;with family/visitor present  GP     Stoy Fenn,CINDY 12/27/2012, 9:49 AM

## 2012-12-27 NOTE — Progress Notes (Signed)
[name] [name], BRITZ NO.:  [id number]  MEDICAL RECORD NO.:  [id number]  LOCATION:  A316                          FACILITY:  [facility]  PHYSICIAN:  Melvyn Novas, MDDATE OF BIRTH:  1926/05/25  DATE OF PROCEDURE: DATE OF DISCHARGE:                                PROGRESS NOTE   An 79 year old significantly demented lady, who has UTI, currently on Rocephin, seems to be responding.  White count down to 8.7.  The patient had been alternating levels of consciousness, seems alert now sitting in chair, has difficulty swallowing, question of aspiration, undergoing modified swallowing study today.  Do not know the results.  Currently on nectar thick full liquid diet.  Hemoglobin drifted down from 10.2 to 8.5 over the previous 3 days.  We will check stools for occult blood.  She had a previous GI bleed, empirically placed on Protonix 40 per day.  She is in no code.  The patient is considered to be terminal, dwindling, and will give conservative care and comfort care only.  Blood pressure 155/69, temperature 98.1, pulse 80 and regular, respiratory rate is 18. Lungs clear to A and P.  No rales, wheeze, or rhonchi.  Heart regular rhythm.  No S3 or S4.  No heaves, thrills, or rubs.  Abdomen is soft, nontender.  Bowel sounds normoactive.  We will do anemia profile.  Check stools for occult blood daily.  Monitor CBC and BMET daily.  Potassium 3.3, repleted intravenously.  We will await dietary recommendations per speech pathology.     Melvyn Novas, MD     RMD/MEDQ  D:  12/27/2012  T:  12/27/2012  Job:  [id number]

## 2012-12-27 NOTE — Progress Notes (Signed)
NAMEMAKANA, FEIGEL NO.:  [id number]  MEDICAL RECORD NO.:  [id number]  LOCATION:  A316                          FACILITY:  [facility]  PHYSICIAN:  Melvyn Novas, MDDATE OF BIRTH:  02-03-1926  DATE OF PROCEDURE: DATE OF DISCHARGE:                                PROGRESS NOTE   The patient had E. coli in the urine, resistant to Cipro and sensitive to Rocephin, change to Rocephin IV 1 g q.24 h.  Blood pressure 120/74, no temperature today, pulse 71 and regular, respiratory rate is 16.  The patient is much more stuporous, lethargic today, essentially dwindling down.  WBCs 9.6, hemoglobin drifted from 10.2-9.4, no obvious signs of bleed clinically.  Lungs are clear.  No rales, wheeze, or rhonchi. Heart:  Regular rhythm.  No murmurs, gallops, or rubs.  Abdomen is soft, nontender.  Bowel sounds are normoactive.  No guarding, no rebound, no masses, no megaly.  Plan right now is to obtain BMET and CBC in a.m. to see further drifting down.  The patient appears to be less responsive.  She is a DNR.  We will provide comfort measures at this point.     Melvyn Novas, MD     RMD/MEDQ  D:  12/26/2012  T:  12/27/2012  Job:  [id number]

## 2012-12-28 LAB — BASIC METABOLIC PANEL
CO2: 29 mEq/L (ref 19–32)
Calcium: 8.7 mg/dL (ref 8.4–10.5)
Glucose, Bld: 102 mg/dL — ABNORMAL HIGH (ref 70–99)
Sodium: 142 mEq/L (ref 135–145)

## 2012-12-28 LAB — CBC WITH DIFFERENTIAL/PLATELET
Eosinophils Relative: 3 % (ref 0–5)
HCT: 25.9 % — ABNORMAL LOW (ref 36.0–46.0)
Lymphocytes Relative: 17 % (ref 12–46)
Lymphs Abs: 1.5 10*3/uL (ref 0.7–4.0)
MCV: 83.5 fL (ref 78.0–100.0)
Monocytes Absolute: 0.7 10*3/uL (ref 0.1–1.0)
Platelets: 191 10*3/uL (ref 150–400)
RBC: 3.1 MIL/uL — ABNORMAL LOW (ref 3.87–5.11)
WBC: 9.1 10*3/uL (ref 4.0–10.5)

## 2012-12-28 MED ORDER — POTASSIUM CHLORIDE 20 MEQ/15ML (10%) PO LIQD: 20.0000 meq | Freq: Every day | ORAL | Status: DC

## 2012-12-28 NOTE — Clinical Documentation Improvement (Signed)
THIS DOCUMENT IS NOT A PERMANENT PART OF THE MEDICAL RECORD  Please update your documentation within the medical record to reflect your response to this query. If you need help knowing how to do this please call [phone].  12/28/12  Dear Dr. Janna Arch Marton Red[place]  A review of the medical record has revealed the following indicators.   Based on your clinical judgment, please clarify and document in a progress note and/or discharge summary the clinical condition associated with the following supporting information:  Supporting Information:  7/30 progress note:  Potassium 3.3, repleted intravenously.   Lab: Potassium  Normal:  3.5-5.1 Patient's levels 7/24 = 3.5 7/26 = 2.7 7/27 = 3.0 7/28 = 4.5 7/30 = 3.3 7/31 = 3.4  Treatment: Potassium IV q1h x 4 doses Potassium po daily BMP Monday Wednesday and Friday   Possible Clinical Conditions?                                  Hypokalemia  Other Condition___________________                  Cannot Clinically Determine_________   Reviewed: no response/coded Jan 25, 2023  Thank You,  Harless Litten RN  Clinical Documentation Specialist: [phone] [facility] Health Information Management [place]

## 2012-12-28 NOTE — Clinical Social Work Note (Signed)
CSW spoke with pt's daughter after discussion with MD. Hospice consult was made at their request. She asked if [facility] would take pt back with hospice. CSW called facility and they feel pt is requiring too much care currently. [name] notified and would like to talk to several other family members before making a decision. Pt was alert during this visit, but pushed food out of her mouth when her daughter attempted feeding. Pt would be [facility] appropriate per MD and hospice. Daughter plans to contact CSW when decision made if today and if not, CSW will follow up tomorrow.  [name] [name], [place] [phone]

## 2012-12-28 NOTE — Progress Notes (Signed)
NAMEJASIMINE, SIMMS               ACCOUNT NO.:  [id number]  MEDICAL RECORD NO.:  [id number]  LOCATION:  A316                          FACILITY:  [facility]  PHYSICIAN:  Melvyn Novas, MDDATE OF BIRTH:  1926/02/06  DATE OF PROCEDURE: DATE OF DISCHARGE:                                PROGRESS NOTE   An 79 year old woman with advanced dementia, acute on chronic UTI and superimposed delirium has status post GI bleed months ago, currently with hemoglobin 8.5, drifted down somewhat could be partially dilutional.  Potassium is 3.4, blood pressure 99/56, temperature 98, pulse 81 and regular, respiratory rate is 18.  The patient has been tolerating nectar thick fluids per Nursing with help to eat, however, her mental status is not improved.  She does sit in chair with a vest on, and is aware of family members at times.  Lungs are clear.  No rales, wheeze, or rhonchi.  Heart:  Regular rate and rhythm.  No murmurs, gallops, or rubs.  Abdomen is soft, nontender.  Bowel sounds normoactive.  Potassium 3.4.  PLAN:  Right now is to add liquid potassium chloride 20 mEq p.o. on a daily basis.  Ask hospice care to evaluate the patient with the daughter's permission for dwindling life expectations.  Continue Rocephin IV for UTI.  Encourage ambulation and encourage oral feeding if possible.     Melvyn Novas, MD     RMD/MEDQ  D:  12/28/2012  T:  12/28/2012  Job:  [id number]

## 2012-12-28 NOTE — Progress Notes (Signed)
[id number] 

## 2012-12-29 LAB — BASIC METABOLIC PANEL
CO2: 30 mEq/L (ref 19–32)
Calcium: 8.7 mg/dL (ref 8.4–10.5)
GFR calc non Af Amer: 82 mL/min — ABNORMAL LOW (ref >90)
Glucose, Bld: 93 mg/dL (ref 70–99)
Potassium: 3.1 mEq/L — ABNORMAL LOW (ref 3.5–5.1)
Sodium: 140 mEq/L (ref 135–145)

## 2012-12-29 LAB — CBC WITH DIFFERENTIAL/PLATELET
Basophils Absolute: 0 10*3/uL (ref 0.0–0.1)
Eosinophils Relative: 4 % (ref 0–5)
Lymphocytes Relative: 19 % (ref 12–46)
Lymphs Abs: 1.7 10*3/uL (ref 0.7–4.0)
MCV: 83.1 fL (ref 78.0–100.0)
Neutrophils Relative %: 68 % (ref 43–77)
Platelets: 217 10*3/uL (ref 150–400)
RBC: 3.07 MIL/uL — ABNORMAL LOW (ref 3.87–5.11)
RDW: 19.2 % — ABNORMAL HIGH (ref 11.5–15.5)
WBC: 8.9 10*3/uL (ref 4.0–10.5)

## 2012-12-29 MED ORDER — POTASSIUM CHLORIDE 20 MEQ/15ML (10%) PO LIQD: 40.0000 meq | Freq: Every day | ORAL | Status: AC

## 2012-12-29 MED ORDER — POTASSIUM CHLORIDE 20 MEQ/15ML (10%) PO LIQD
40.0000 meq | Freq: Every day | ORAL | Status: DC
  Administered 2012-12-29: 40 meq via ORAL
  Filled 2012-12-29: qty 30

## 2012-12-29 NOTE — Progress Notes (Signed)
UR chart review completed.  

## 2012-12-29 NOTE — Progress Notes (Signed)
Assisted tech in giving pt bath. Linens changed. Mouth care given. Even respirations. No sign of  distress at this time.

## 2012-12-29 NOTE — Plan of Care (Signed)
Problem: Phase II Progression Outcomes Goal: Progress activity as tolerated unless otherwise ordered Outcome: Not Applicable Date Met:  12/29/12 Pt is bedridden, up with lift to chair

## 2012-12-29 NOTE — Progress Notes (Signed)
Pt discharged to [facility] per Dr. Janna Arch.  Pt's PICC line in RUA, patent, WNL, saline locked.  Pt's VS stable at this time.  Report called to Corrine, Hospice Nurse.  Questions answered.  Verbalized understanding. Pt left floor via EMS stretcher accompanied by EMS transporters in stable condition.  Daughter at bedside at time of discharge.

## 2012-12-29 NOTE — Discharge Summary (Signed)
[name], [name] [name] NO.:  [id number]  MEDICAL RECORD NO.:  [id number]  LOCATION:  A316                          FACILITY:  [facility]  PHYSICIAN:  Melvyn Novas, MDDATE OF BIRTH:  June 18, 1925  DATE OF ADMISSION:  12/21/2012 DATE OF DISCHARGE:  [facility]                         DISCHARGE SUMMARY-REFERRING   The patient is an 79 year old, moderately to severely demented white female, who has been in progressive state of decline for the preceding 18 months.  She basically has dwindles.  She had status post upper GI bleed requiring for transfusions several months ago, this has been rectified, continuing dementia, hypothyroidism well controlled, multiple compression fractures, falling, unsteady gait, generalized debility. She likewise has hypertension as well as hyperlipidemia and iron deficiency.  She was seen in hospital complaining of chills and found to have UTI, consideration was given to urosepsis, initially started on 3 different antibiotics, septicemia never seemed to emerge.  Her UTI was treated, she had resistant organisms and was switched from Cipro to Rocephin IV for which this organism was sensitive.  She was continued on Rocephin for 5 days with resolution of any UTI symptomatology.  Her hemoglobin drifted down from 10.2 to 8.4, this could be partially dilutional, we are not certain.  Stools were negative for occult blood. There was no evidence of gross clinical GI bleed.  Her potassium was low minimally on a daily basis supplied with intravenous, then oral potassium.  She seemed to take a nectar thick full-liquid diet and tolerated this well with assistance and  feeding.  She, after a long discussion with the family felt to be a candidate for hospice home care or hospice care at the residence and she is discharged on the following medicines; Ventolin HFA inhaler 2 puffs q.i.d. p.r.n., Norvasc 5 mg p.o. daily, aspirin 81 mg p.o. daily, Aricept 10 mg p.o.  at bedtime, Niferex 150 mg p.o. daily, Chronulac lactulose syrup 10 g/15 mL p.o. t.i.d., Synthroid 75 mcg per day, Protonix 40 mg p.o. daily, and Ultram 50 mg p.o. t.i.d.  She will be in the care of hospice and transfer there immediately.     Melvyn Novas, MD     RMD/MEDQ  D:  12/29/2012  T:  12/29/2012  Job:  [id number]

## 2012-12-29 NOTE — Clinical Social Work Note (Signed)
CSW followed up with pt's daughter and son-in-law this morning to continue conversation. They were still processing information this morning, but feel [facility] is most appropriate. Bed is available today and family accept. Family concerned that pt may not have much social interaction as this is something that she enjoys. Hospice is willing to have pt in wheelchair at desk as long as able. They can also provide a volunteer for pt. Family aware. CSW faxed d/c summary and will arrange transport via [facility]. Out of facility DNR sent with pt.  Derenda Fennel, [place] [phone]

## 2012-12-29 NOTE — Discharge Summary (Signed)
[id number] 

## 2013-01-29 DEATH — deceased

## 2013-05-09 ENCOUNTER — Telehealth: Payer: Self-pay | Admitting: Gastroenterology

## 2013-05-09 NOTE — Telephone Encounter (Signed)
Needs f/u E30 with [facility] only for GU, anemia. EGD cancelled 11/2012.

## 2013-05-10 ENCOUNTER — Encounter: Payer: Self-pay | Admitting: Gastroenterology

## 2013-05-15 ENCOUNTER — Encounter: Payer: Self-pay | Admitting: Gastroenterology

## 2013-05-15 NOTE — Telephone Encounter (Signed)
Pt is aware of OV on 1/14 at 0930 with [facility] and appt card was mailed

## 2013-06-04 ENCOUNTER — Encounter: Payer: Self-pay | Admitting: Gastroenterology

## 2013-06-13 ENCOUNTER — Ambulatory Visit: Payer: Medicare Other | Admitting: Gastroenterology

## 2013-06-28 ENCOUNTER — Ambulatory Visit: Payer: Medicare Other | Admitting: Gastroenterology

## 2013-08-14 NOTE — Progress Notes (Signed)
REVIEWED. PT CANCELLED EGD. 

## 2014-12-01 IMAGING — CT CT T SPINE W/O CM
3 of 7 series · 13 of 33 positions shown, 15 images · non-contrast
Comparison: Thoracic spine radiographs 09/01/2012.

CLINICAL DATA: Back pain.

CT THORACIC SPINE WITHOUT CONTRAST
TECHNIQUE: Multidetector CT imaging of the thoracic spine was
performed without intravenous contrast administration. Multiplanar
CT image reconstructions were also generated

[Series 2: thoracic spine 2.0 b30s · axial · 0.26mm/px · z∈[-206,-20]mm · 7 of 121 slices shown, 9 images]
[im 14/121  soft-tissue]
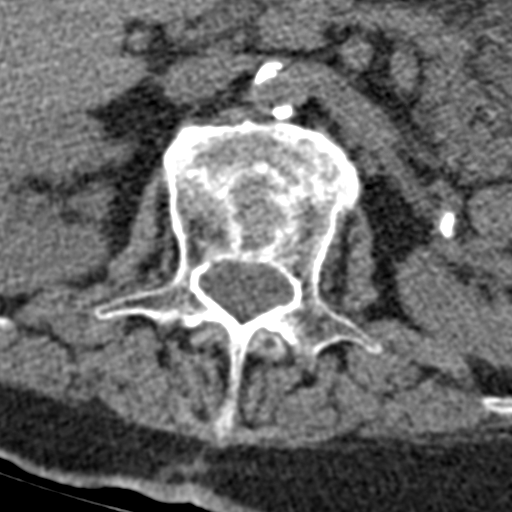
[im 14/121  bone]
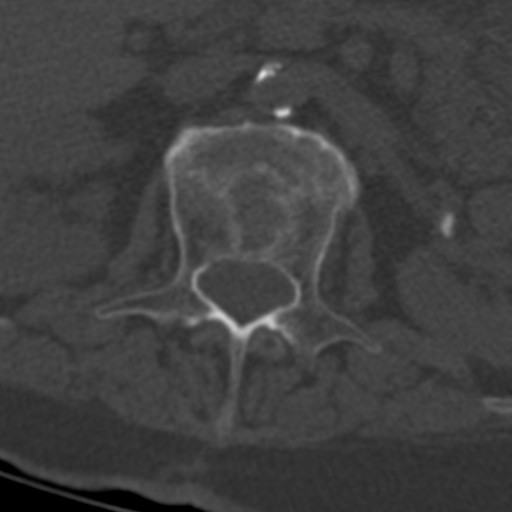
[im 27/121  bone]
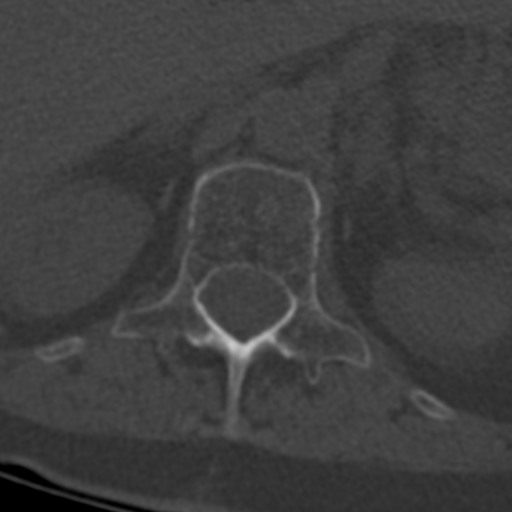
[im 41/121  bone]
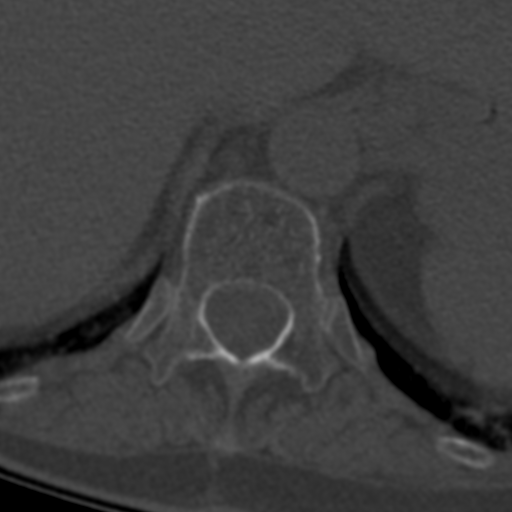
[im 67/121  bone]
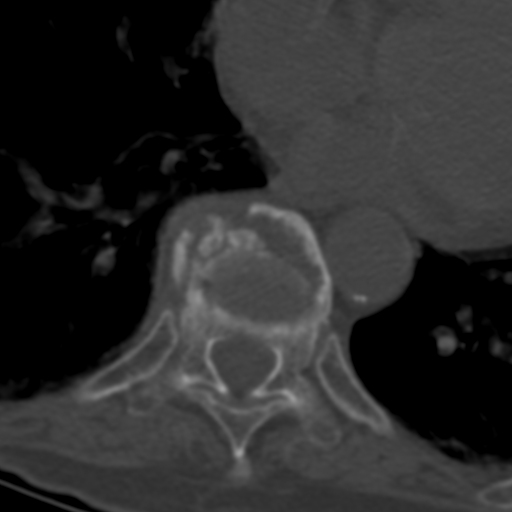
[im 81/121  soft-tissue]
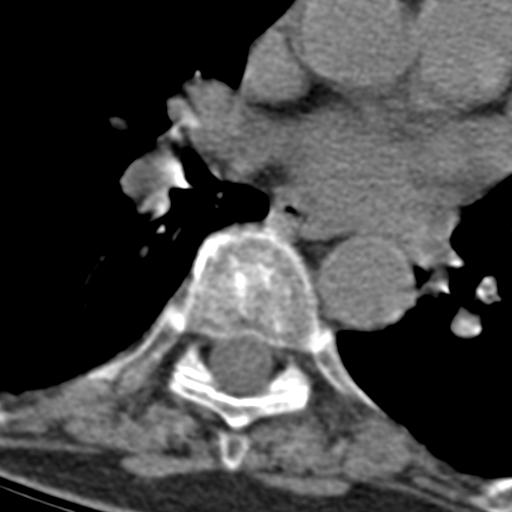
[im 81/121  bone]
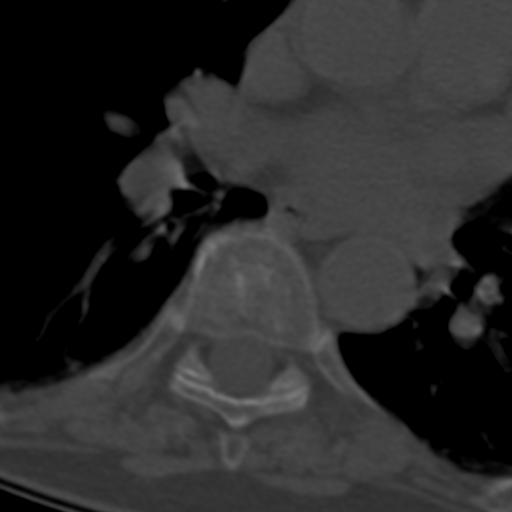
[im 94/121  bone]
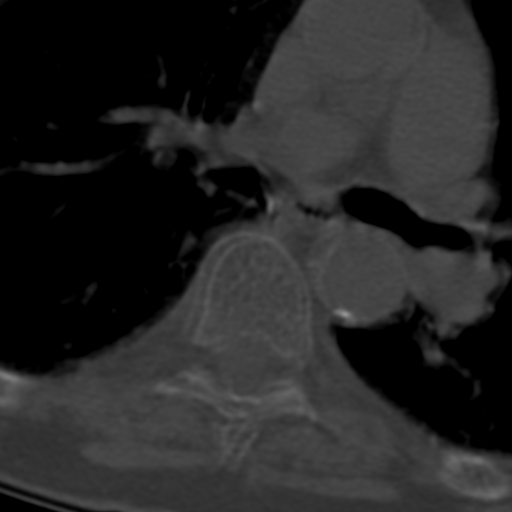
[im 107/121  bone]
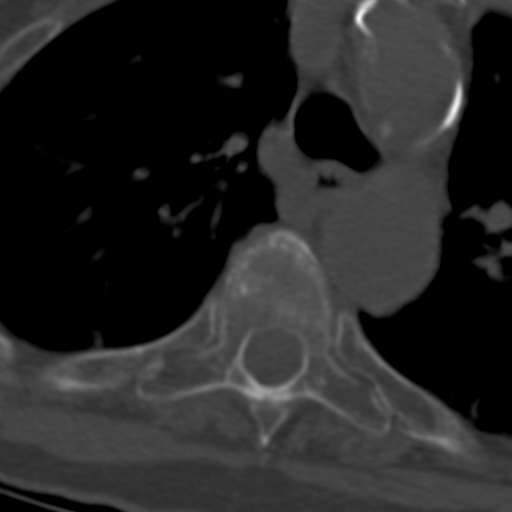

[Series 6: thoracic spine 2.0 spo sag · sagittal · 0.24mm/px · 5 of 51 slices shown]
[im 9/51  bone]
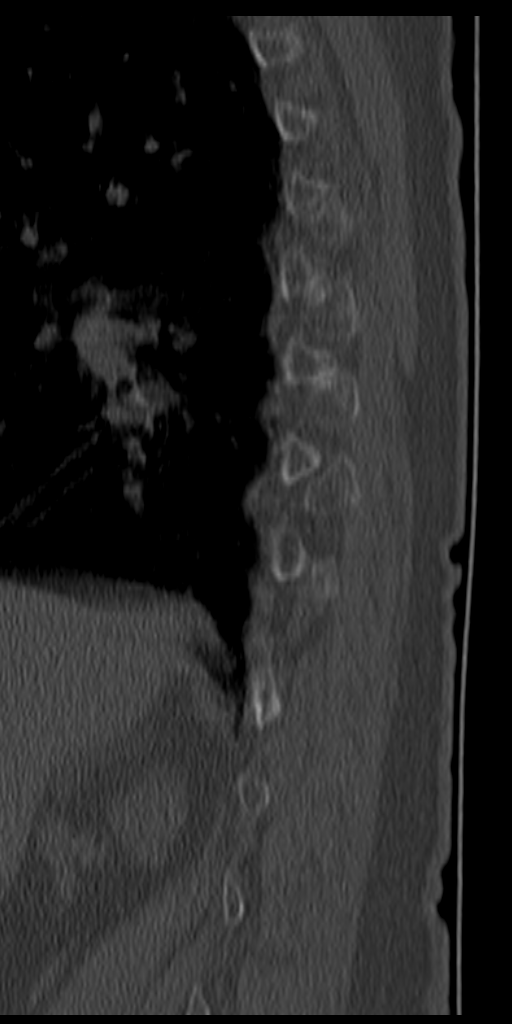
[im 17/51  bone]
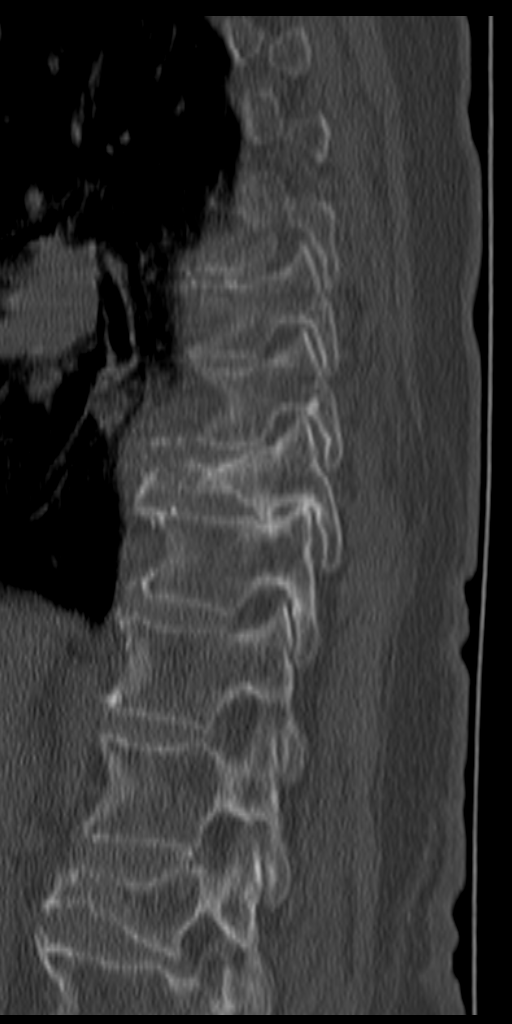
[im 26/51  bone]
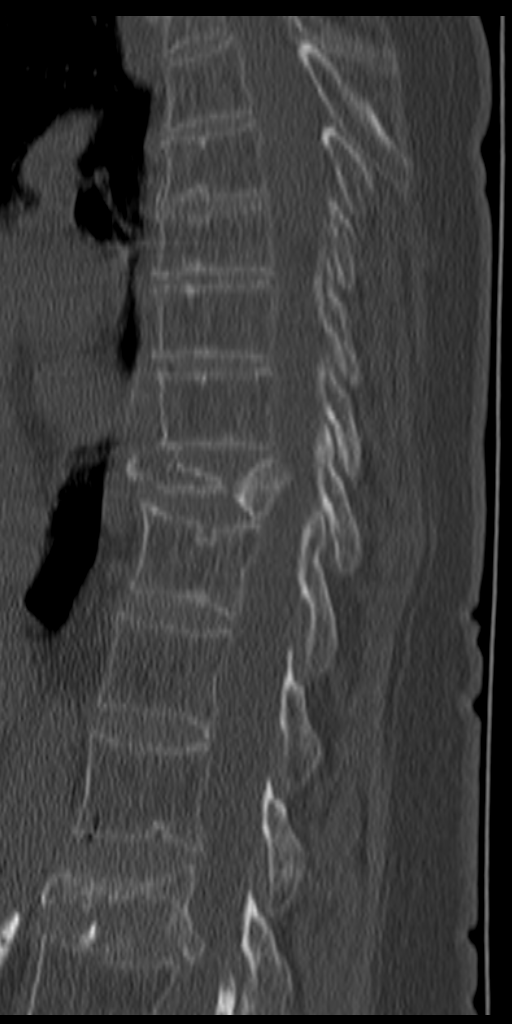
[im 34/51  bone]
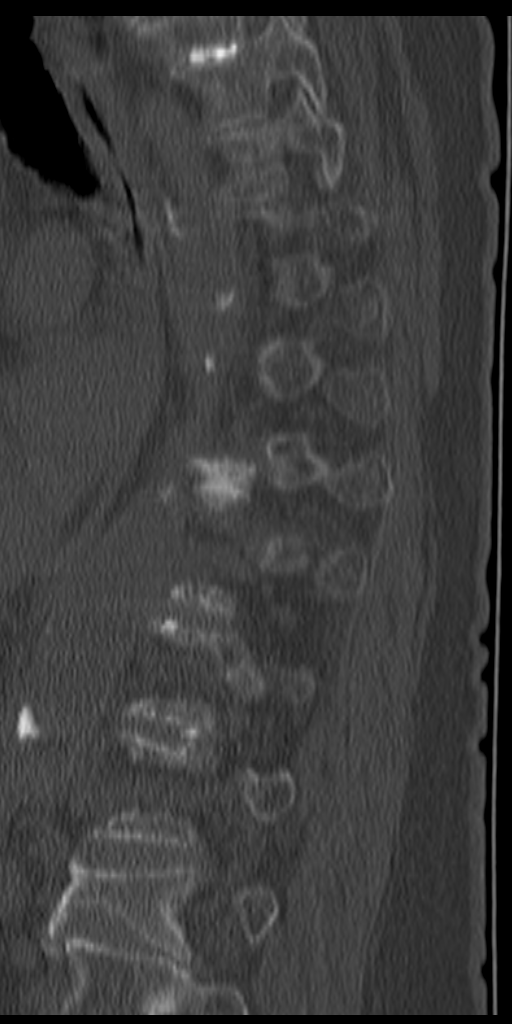
[im 42/51  bone]
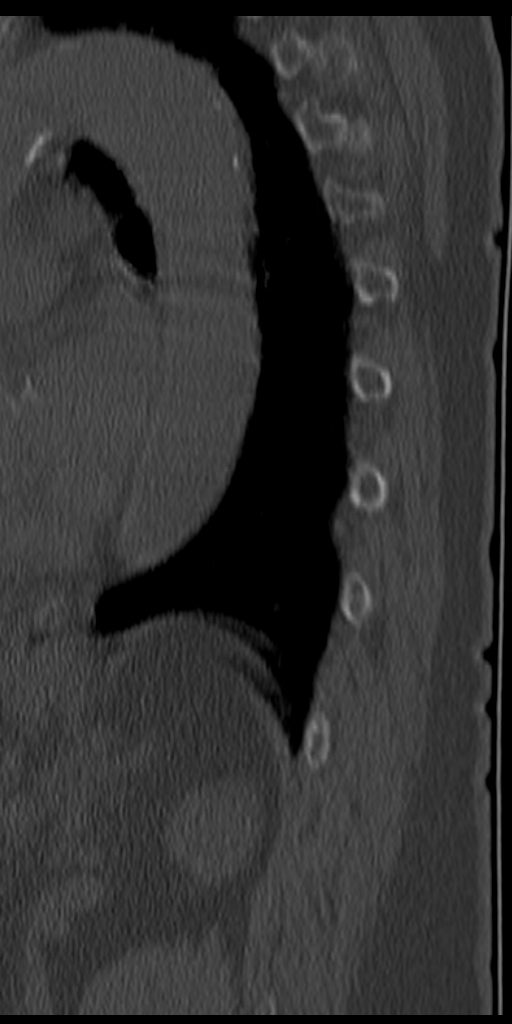

[Series 8: thoracic spine 2.0 cor 1 of 2 · coronal · 0.14mm/px · 1 of 61 slices shown]
[im 31/61  bone]
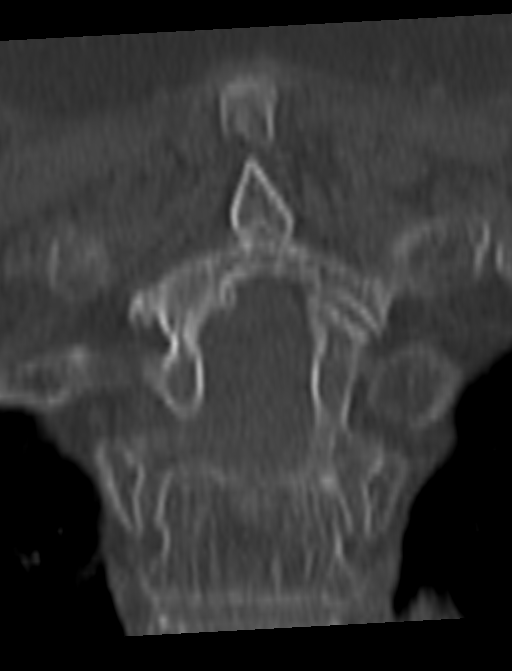

[13 of 33 positions shown; findings below may reference images not displayed]

FINDINGS: There is a severe compression deformity of the T10
vertebral body with a vertebral plana appearance and mild
retropulsion.  No significant canal stenosis.  The pedicles are
intact.  No significant paraspinal hematoma to suggest this is an
acute fracture.  It is new since prior chest x-ray from 4644.
There is also a vertebral plana compression deformity of L2.  This
also appears to be a remote finding.  Mild canal compromise.

Significant underlying osteoporosis.  The facets are normally
aligned.  No significant pulmonary findings.
IMPRESSION: Remote appearing T10 and L2 compression fractures.
